# Patient Record
Sex: Male | Born: 1970
Health system: Southern US, Community
[De-identification: ages and names within clinical notes are randomized; demographics above are authoritative.]

## PROBLEM LIST (undated history)

## (undated) DIAGNOSIS — E785 Hyperlipidemia, unspecified: Secondary | ICD-10-CM

## (undated) DIAGNOSIS — I10 Essential (primary) hypertension: Secondary | ICD-10-CM

## (undated) DIAGNOSIS — M545 Low back pain, unspecified: Secondary | ICD-10-CM

## (undated) DIAGNOSIS — K219 Gastro-esophageal reflux disease without esophagitis: Secondary | ICD-10-CM

## (undated) DIAGNOSIS — K649 Unspecified hemorrhoids: Secondary | ICD-10-CM

## (undated) HISTORY — DX: Essential (primary) hypertension: I10

## (undated) HISTORY — PX: COLONOSCOPY: SHX174

## (undated) HISTORY — PX: BACK SURGERY: SHX140

## (undated) HISTORY — DX: Unspecified hemorrhoids: K64.9

## (undated) HISTORY — DX: Low back pain, unspecified: M54.50

## (undated) HISTORY — PX: OTHER SURGICAL HISTORY: SHX169

## (undated) HISTORY — PX: ELBOW SURGERY: SHX618

## (undated) HISTORY — DX: Low back pain: M54.5

## (undated) HISTORY — PX: SHOULDER SURGERY: SHX246

## (undated) HISTORY — DX: Hyperlipidemia, unspecified: E78.5

## (undated) SURGERY — Surgical Case
Anesthesia: *Unknown

---

## 2000-01-30 ENCOUNTER — Emergency Department (HOSPITAL_COMMUNITY): Admission: EM | Admit: 2000-01-30 | Discharge: 2000-01-30 | Payer: Self-pay | Admitting: Emergency Medicine

## 2006-05-15 ENCOUNTER — Ambulatory Visit: Payer: Self-pay | Admitting: Family Medicine

## 2006-05-15 LAB — CONVERTED CEMR LAB
Basophils Relative: 1 % (ref 0.0–1.0)
Calcium: 9.1 mg/dL (ref 8.4–10.5)
Chloride: 103 meq/L (ref 96–112)
Chol/HDL Ratio, serum: 6.4
Cholesterol: 239 mg/dL (ref 0–200)
Creatinine, Ser: 1 mg/dL (ref 0.4–1.5)
Eosinophil percent: 6.3 % — ABNORMAL HIGH (ref 0.0–5.0)
GFR calc non Af Amer: 90 mL/min
Glucose, Bld: 88 mg/dL (ref 70–99)
HCT: 46.2 % (ref 39.0–52.0)
HDL: 37.5 mg/dL — ABNORMAL LOW (ref >39.0)
Lymphocytes Relative: 33.9 % (ref 12.0–46.0)
MCHC: 35.1 g/dL (ref 30.0–36.0)
MCV: 88.4 fL (ref 78.0–100.0)
Monocytes Absolute: 0.8 10*3/uL — ABNORMAL HIGH (ref 0.2–0.7)
Neutro Abs: 3.3 10*3/uL (ref 1.4–7.7)
RBC: 5.22 M/uL (ref 4.22–5.81)
Sodium: 138 meq/L (ref 135–145)
TSH: 1.89 microintl units/mL (ref 0.35–5.50)
VLDL: 32 mg/dL (ref 0–40)

## 2006-06-16 ENCOUNTER — Ambulatory Visit: Payer: Self-pay | Admitting: Family Medicine

## 2006-08-19 ENCOUNTER — Ambulatory Visit: Payer: Self-pay | Admitting: Family Medicine

## 2006-08-19 LAB — CONVERTED CEMR LAB
Albumin: 3.6 g/dL (ref 3.5–5.2)
Alkaline Phosphatase: 64 units/L (ref 39–117)
Direct LDL: 136.5 mg/dL
HDL: 45.6 mg/dL (ref >39.0)
Total Bilirubin: 0.6 mg/dL (ref 0.3–1.2)
Total CHOL/HDL Ratio: 4.5
Triglycerides: 154 mg/dL — ABNORMAL HIGH (ref 0–149)
VLDL: 31 mg/dL (ref 0–40)

## 2006-10-20 ENCOUNTER — Ambulatory Visit: Payer: Self-pay | Admitting: Family Medicine

## 2006-11-05 ENCOUNTER — Ambulatory Visit: Payer: Self-pay | Admitting: Family Medicine

## 2006-11-05 LAB — CONVERTED CEMR LAB
Bilirubin, Direct: 0.1 mg/dL (ref 0.0–0.3)
Cholesterol: 179 mg/dL (ref 0–200)
HDL: 34.4 mg/dL — ABNORMAL LOW (ref >39.0)
LDL Cholesterol: 118 mg/dL — ABNORMAL HIGH (ref 0–99)
Total Bilirubin: 0.7 mg/dL (ref 0.3–1.2)
Total CHOL/HDL Ratio: 5.2
Total Protein: 7 g/dL (ref 6.0–8.3)
Triglycerides: 133 mg/dL (ref 0–149)

## 2007-01-22 ENCOUNTER — Ambulatory Visit: Payer: Self-pay | Admitting: Family Medicine

## 2007-01-22 DIAGNOSIS — K625 Hemorrhage of anus and rectum: Secondary | ICD-10-CM

## 2007-02-18 ENCOUNTER — Ambulatory Visit: Payer: Self-pay | Admitting: Gastroenterology

## 2007-02-22 DIAGNOSIS — M279 Disease of jaws, unspecified: Secondary | ICD-10-CM

## 2007-02-24 ENCOUNTER — Ambulatory Visit: Payer: Self-pay | Admitting: Gastroenterology

## 2007-02-24 ENCOUNTER — Ambulatory Visit: Payer: Self-pay | Admitting: Family Medicine

## 2007-02-24 DIAGNOSIS — E785 Hyperlipidemia, unspecified: Secondary | ICD-10-CM

## 2007-02-24 DIAGNOSIS — I10 Essential (primary) hypertension: Secondary | ICD-10-CM | POA: Insufficient documentation

## 2007-02-24 DIAGNOSIS — K648 Other hemorrhoids: Secondary | ICD-10-CM

## 2007-07-10 DIAGNOSIS — M545 Low back pain: Secondary | ICD-10-CM

## 2007-07-15 ENCOUNTER — Ambulatory Visit: Payer: Self-pay | Admitting: Family Medicine

## 2007-07-15 DIAGNOSIS — B369 Superficial mycosis, unspecified: Secondary | ICD-10-CM

## 2008-01-22 ENCOUNTER — Telehealth: Payer: Self-pay | Admitting: Family Medicine

## 2008-12-07 ENCOUNTER — Ambulatory Visit: Payer: Self-pay | Admitting: Family Medicine

## 2008-12-07 LAB — CONVERTED CEMR LAB
ALT: 40 units/L (ref 0–53)
Albumin: 4.7 g/dL (ref 3.5–5.2)
BUN: 16 mg/dL (ref 6–23)
Basophils Relative: 0.3 % (ref 0.0–3.0)
Bilirubin Urine: NEGATIVE
CO2: 32 meq/L (ref 19–32)
Chloride: 108 meq/L (ref 96–112)
Cholesterol: 166 mg/dL (ref 0–200)
Creatinine, Ser: 0.9 mg/dL (ref 0.4–1.5)
Eosinophils Absolute: 0.5 10*3/uL (ref 0.0–0.7)
Eosinophils Relative: 9.1 % — ABNORMAL HIGH (ref 0.0–5.0)
Glucose, Urine, Semiquant: NEGATIVE
HCT: 45.2 % (ref 39.0–52.0)
Ketones, urine, test strip: NEGATIVE
LDL Cholesterol: 105 mg/dL — ABNORMAL HIGH (ref 0–99)
Lymphs Abs: 2 10*3/uL (ref 0.7–4.0)
MCHC: 35 g/dL (ref 30.0–36.0)
MCV: 89.2 fL (ref 78.0–100.0)
Monocytes Absolute: 0.6 10*3/uL (ref 0.1–1.0)
Potassium: 4.3 meq/L (ref 3.5–5.1)
RBC: 5.07 M/uL (ref 4.22–5.81)
Specific Gravity, Urine: 1.015
TSH: 1.46 microintl units/mL (ref 0.35–5.50)
Total Protein: 7.6 g/dL (ref 6.0–8.3)
Triglycerides: 116 mg/dL (ref 0.0–149.0)
Urobilinogen, UA: 0.2
WBC: 5.8 10*3/uL (ref 4.5–10.5)

## 2008-12-12 ENCOUNTER — Ambulatory Visit: Payer: Self-pay | Admitting: Family Medicine

## 2010-02-20 ENCOUNTER — Telehealth: Payer: Self-pay | Admitting: Family Medicine

## 2010-04-25 ENCOUNTER — Telehealth: Payer: Self-pay | Admitting: Family Medicine

## 2010-06-12 NOTE — Progress Notes (Signed)
Summary: LISINOPRIL  Phone Note Refill Request Message from:  [phone] on February 20, 2010 9:50 AM  E-Scribe Request for lisinopril 20mg  take 1 by mouth at bedtime, med not on active med list, ok to refill?   Method Requested: Electronic Initial call taken by: Mervin Hack CMA Duncan Dull),  February 20, 2010 9:53 AM  Follow-up for Phone Call        please give the patient 30 days of medication.  No refills.  His last physical examination was August 2010 he needs to set up a complete physical examination sometime in the next two to 3 weeks.  At that point, then, we will give him refills x 1 year on all his medicines Follow-up by: Roderick Pee MD,  February 20, 2010 10:11 AM  Additional Follow-up for Phone Call Additional follow up Details #1::        Rx faxed to pharmacy, left detailed message on home phone answering machine that pt needs to schedule appt in the next 2-3 weeks. Additional Follow-up by: Mervin Hack CMA Duncan Dull),  February 20, 2010 12:32 PM    New/Updated Medications: LISINOPRIL 20 MG TABS (LISINOPRIL) take 1 by mouth once daily at bedtime Prescriptions: LISINOPRIL 20 MG TABS (LISINOPRIL) take 1 by mouth once daily at bedtime  #30 x 0   Entered by:   Mervin Hack CMA (AAMA)   Authorized by:   Roderick Pee MD   Signed by:   Mervin Hack CMA (AAMA) on 02/20/2010   Method used:   [facility]        [facility]  [place] [address] [facility]       [address]       [place], [place]  [address]       Ph: [phone] or [phone]       Fax: [phone]   RxID:   [id number]

## 2010-06-14 NOTE — Progress Notes (Signed)
Summary: REFILL REQUEST (Pt has cpx appt on 07/03/10 at 10:30am)  Phone Note Refill Request   Refills Requested: Medication #1:  LISINOPRIL 20 MG TABS take 1 by mouth once daily at bedtime.   Notes[facility]... Pt has cpx appt scheduled for Feb. 21, 2012 at 10:30am.  Pt requests that enough of med to do him till his cpx appt be sent to [facility]... Pt has cpx appt scheduled for Feb. 21, 2012 at 10:30am.   Initial call taken by: Debbra Riding,  April 25, 2010 2:28 PM    Prescriptions: ZOCOR 40 MG  TABS (SIMVASTATIN) 1 at bedtime  #30 Tablet x 0   Entered by:   Kern Reap CMA (AAMA)   Authorized by:   Roderick Pee MD   Signed by:   Kern Reap CMA (AAMA) on 04/26/2010   Method used:   [facility]        [facility]  [place] [address] [facility]       [address]       [place], [place]  [address]       Ph: [phone] or [phone]       Fax: [phone]   RxID:   [id number] LISINOPRIL 20 MG TABS (LISINOPRIL) take 1 by mouth once daily at bedtime  #30 x 0   Entered by:   Kern Reap CMA (AAMA)   Authorized by:   Roderick Pee MD   Signed by:   Kern Reap CMA (AAMA) on 04/26/2010   Method used:   [facility]        [facility]  [place] [address] [facility]       [address]       [place], [place]  [address]       Ph: [phone] or [phone]       Fax: [phone]   RxID:   [id number]

## 2010-07-02 ENCOUNTER — Encounter: Payer: Self-pay | Admitting: Family Medicine

## 2010-07-03 ENCOUNTER — Ambulatory Visit (INDEPENDENT_AMBULATORY_CARE_PROVIDER_SITE_OTHER): Payer: BC Managed Care – PPO | Admitting: Family Medicine

## 2010-07-03 ENCOUNTER — Encounter: Payer: Self-pay | Admitting: Family Medicine

## 2010-07-03 DIAGNOSIS — I10 Essential (primary) hypertension: Secondary | ICD-10-CM

## 2010-07-03 DIAGNOSIS — G8929 Other chronic pain: Secondary | ICD-10-CM

## 2010-07-03 DIAGNOSIS — E785 Hyperlipidemia, unspecified: Secondary | ICD-10-CM

## 2010-07-03 DIAGNOSIS — M542 Cervicalgia: Secondary | ICD-10-CM

## 2010-07-03 LAB — CBC WITH DIFFERENTIAL/PLATELET
Basophils Absolute: 0.1 10*3/uL (ref 0.0–0.1)
Basophils Relative: 0.8 % (ref 0.0–3.0)
Eosinophils Relative: 6.8 % — ABNORMAL HIGH (ref 0.0–5.0)
Hemoglobin: 16.4 g/dL (ref 13.0–17.0)
Lymphocytes Relative: 31.5 % (ref 12.0–46.0)
Monocytes Relative: 9.3 % (ref 3.0–12.0)
Neutro Abs: 3.4 10*3/uL (ref 1.4–7.7)
RBC: 5.3 Mil/uL (ref 4.22–5.81)
RDW: 12.8 % (ref 11.5–14.6)
WBC: 6.6 10*3/uL (ref 4.5–10.5)

## 2010-07-03 LAB — HEPATIC FUNCTION PANEL
ALT: 56 U/L — ABNORMAL HIGH (ref 0–53)
AST: 32 U/L (ref 0–37)
Alkaline Phosphatase: 60 U/L (ref 39–117)
Total Bilirubin: 0.9 mg/dL (ref 0.3–1.2)

## 2010-07-03 LAB — BASIC METABOLIC PANEL
Calcium: 9.6 mg/dL (ref 8.4–10.5)
GFR: 96.69 mL/min (ref >60.00)
Potassium: 4.6 mEq/L (ref 3.5–5.1)
Sodium: 143 mEq/L (ref 135–145)

## 2010-07-03 LAB — LIPID PANEL
Total CHOL/HDL Ratio: 6
VLDL: 35.8 mg/dL (ref 0.0–40.0)

## 2010-07-03 LAB — POCT URINALYSIS DIPSTICK
Bilirubin, UA: NEGATIVE
Glucose, UA: NEGATIVE
Leukocytes, UA: NEGATIVE
Nitrite, UA: NEGATIVE
Urobilinogen, UA: 0.2

## 2010-07-03 LAB — TSH: TSH: 1.36 u[IU]/mL (ref 0.35–5.50)

## 2010-07-03 MED ORDER — SIMVASTATIN 40 MG PO TABS: 40.0000 mg | ORAL_TABLET | Freq: Every day | ORAL | Status: DC

## 2010-07-03 MED ORDER — LISINOPRIL 20 MG PO TABS: 20.0000 mg | ORAL_TABLET | Freq: Every day | ORAL | Status: DC

## 2010-07-03 NOTE — Progress Notes (Signed)
  Subjective:    Patient ID: Bruce Terry, male    DOB: 07-19-1970, 40 y.o.   MRN: [id number]  HPI Bruce Terry is a 40 year old male, married, nonsmoker comes in today for evaluation of hyperlipidemia, hypertension, and a new problem of neck pain.  His hyperlipidemia is managed with Zocor 40 mg nightly.  He was on lisinopril 20 mg daily for hypertension, but stopped it.  He stated caused some side effects, made him feel odd in the morning.  For the past 6, months.  He's had constant neck pain.  He points to the C4-C5 area in the midline of his neck as a source of his pain.  He describes as a dull ache initially it was sharp.  It is constant.  A5 on a scale of one to 10.  It does not radiate.  No history of trauma.  Neurologic and musculoskeletal review of systems negative   Review of Systems  Constitutional: Negative.   HENT: Negative.   Eyes: Negative.   Respiratory: Negative.   Cardiovascular: Negative.   Gastrointestinal: Negative.   Genitourinary: Negative.   Musculoskeletal: Negative.   Skin: Negative.   Neurological: Negative.   Hematological: Negative.   Psychiatric/Behavioral: Negative.        Objective:   Physical Exam  Constitutional: He is oriented to person, place, and time. He appears well-developed and well-nourished.  HENT:  Head: Normocephalic and atraumatic.  Right Ear: External ear normal.  Left Ear: External ear normal.  Nose: Nose normal.  Mouth/Throat: Oropharynx is clear and moist.  Eyes: Conjunctivae and EOM are normal. Pupils are equal, round, and reactive to light.  Neck: Normal range of motion. Neck supple. No JVD present. No tracheal deviation present. No thyromegaly present.  Cardiovascular: Normal rate, regular rhythm, normal heart sounds and intact distal pulses.  Exam reveals no gallop and no friction rub.   No murmur heard. Pulmonary/Chest: Effort normal and breath sounds normal. No stridor. No respiratory distress. He has no wheezes. He has no  rales. He exhibits no tenderness.  Abdominal: Soft. Bowel sounds are normal. He exhibits no distension and no mass. There is no tenderness. There is no rebound and no guarding.  Genitourinary: Rectum normal, prostate normal and penis normal. Guaiac negative stool. No penile tenderness.  Musculoskeletal: Normal range of motion. He exhibits no edema and no tenderness.  Lymphadenopathy:    He has no cervical adenopathy.  Neurological: He is alert and oriented to person, place, and time. He has normal reflexes. No cranial nerve deficit. He exhibits normal muscle tone.  Skin: Skin is warm and dry. No rash noted. No erythema. No pallor.  Psychiatric: He has a normal mood and affect. His behavior is normal. Judgment and thought content normal.          Assessment & Plan:  Hyperlipidemia,,,,,,,,,,,, continue simvastatin 40 mg daily check lipid panel today.   hypertension,,,,,,,,, restart lisinopril 10 mg daily BP checked daily.  Return in 4 weeks for follow-up.  Neck pain,,,,,,,,,,, Motrin 400 mg b.i.d. PT consult ASAP

## 2010-07-03 NOTE — Patient Instructions (Signed)
Continued the simvastatin nightly at bedtime, and add an aspirin tablet.  Restart the lisinopril to 10 mg daily in the morning.  Check her blood pressure daily in the morning.  Follow-up in 4 weeks.  When he returned ring.  A record of all your blood pressure readings and that device

## 2010-07-04 LAB — LDL CHOLESTEROL, DIRECT: Direct LDL: 168.5 mg/dL

## 2010-07-10 ENCOUNTER — Ambulatory Visit: Payer: BC Managed Care – PPO

## 2010-07-25 ENCOUNTER — Emergency Department (HOSPITAL_COMMUNITY): Payer: BC Managed Care – PPO

## 2010-07-25 ENCOUNTER — Emergency Department (HOSPITAL_COMMUNITY)
Admission: EM | Admit: 2010-07-25 | Discharge: 2010-07-25 | Disposition: A | Discharge status: Home / Self Care | Payer: BC Managed Care – PPO | Attending: Emergency Medicine | Admitting: Emergency Medicine

## 2010-07-25 DIAGNOSIS — M546 Pain in thoracic spine: Secondary | ICD-10-CM | POA: Insufficient documentation

## 2010-07-25 DIAGNOSIS — K7689 Other specified diseases of liver: Secondary | ICD-10-CM | POA: Insufficient documentation

## 2010-07-25 DIAGNOSIS — IMO0002 Reserved for concepts with insufficient information to code with codable children: Secondary | ICD-10-CM | POA: Insufficient documentation

## 2010-07-25 DIAGNOSIS — M545 Low back pain, unspecified: Secondary | ICD-10-CM | POA: Insufficient documentation

## 2010-07-25 DIAGNOSIS — M542 Cervicalgia: Secondary | ICD-10-CM | POA: Insufficient documentation

## 2010-07-25 DIAGNOSIS — M79609 Pain in unspecified limb: Secondary | ICD-10-CM | POA: Insufficient documentation

## 2010-07-25 DIAGNOSIS — R079 Chest pain, unspecified: Secondary | ICD-10-CM | POA: Insufficient documentation

## 2010-07-25 LAB — CBC
MCH: 30.4 pg (ref 26.0–34.0)
Platelets: 231 10*3/uL (ref 150–400)
RBC: 5.13 MIL/uL (ref 4.22–5.81)
WBC: 6.7 10*3/uL (ref 4.0–10.5)

## 2010-07-25 LAB — LACTIC ACID, PLASMA: Lactic Acid, Venous: 2.1 mmol/L (ref 0.5–2.2)

## 2010-07-25 LAB — COMPREHENSIVE METABOLIC PANEL
ALT: 53 U/L (ref 0–53)
Calcium: 9 mg/dL (ref 8.4–10.5)
Glucose, Bld: 159 mg/dL — ABNORMAL HIGH (ref 70–99)
Sodium: 137 mEq/L (ref 135–145)
Total Protein: 7.6 g/dL (ref 6.0–8.3)

## 2010-07-25 LAB — POCT I-STAT, CHEM 8
Hemoglobin: 15.6 g/dL (ref 13.0–17.0)
Sodium: 141 mEq/L (ref 135–145)
TCO2: 25 mmol/L (ref 0–100)

## 2010-07-25 LAB — PROTIME-INR: Prothrombin Time: 12.9 seconds (ref 11.6–15.2)

## 2010-07-25 MED ORDER — IOHEXOL 300 MG/ML  SOLN: 100.0000 mL | Freq: Once | INTRAMUSCULAR | Status: DC | PRN

## 2010-07-30 ENCOUNTER — Ambulatory Visit (INDEPENDENT_AMBULATORY_CARE_PROVIDER_SITE_OTHER): Payer: BC Managed Care – PPO | Admitting: Family Medicine

## 2010-07-30 ENCOUNTER — Encounter: Payer: Self-pay | Admitting: Family Medicine

## 2010-07-30 VITALS — BP 148/108 | Temp 98.3°F | Wt 264.0 lb

## 2010-07-30 DIAGNOSIS — M545 Low back pain: Secondary | ICD-10-CM

## 2010-07-30 MED ORDER — OXYCODONE HCL 10 MG PO TB12: ORAL_TABLET | ORAL | Status: DC

## 2010-07-30 NOTE — Patient Instructions (Signed)
Called Dr. Hoy Register orthopedic office in [phone] and given an appointment to see one of the doctors ASAP.......Marland Kitchen Dr. Cleophas Dunker, Dr. Valentina Gu
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
## 2010-07-31 ENCOUNTER — Ambulatory Visit: Payer: BC Managed Care – PPO | Admitting: Family Medicine

## 2010-07-31 ENCOUNTER — Other Ambulatory Visit: Payer: Self-pay | Admitting: Orthopedic Surgery

## 2010-07-31 ENCOUNTER — Ambulatory Visit
Admission: RE | Admit: 2010-07-31 | Discharge: 2010-07-31 | Disposition: A | Discharge status: Home / Self Care | Payer: BC Managed Care – PPO | Source: Ambulatory Visit | Attending: Orthopedic Surgery | Admitting: Orthopedic Surgery

## 2010-07-31 DIAGNOSIS — M549 Dorsalgia, unspecified: Secondary | ICD-10-CM

## 2010-07-31 DIAGNOSIS — R200 Anesthesia of skin: Secondary | ICD-10-CM

## 2010-09-25 NOTE — Assessment & Plan Note (Signed)
[facility]                         GASTROENTEROLOGY OFFICE NOTE   NAME:Buskey, ROXAS CLYMER                      MRN:          [id number]  DATE:02/18/2007                            DOB:          11-18-1970    REFERRING PHYSICIAN:  Tinnie Gens A. Tawanna Cooler, M.D.   REASON FOR CONSULTATION:  Hematochezia.   HISTORY OF PRESENT ILLNESS:  This is a 40 year old white male with a  several-year history of intermittent small volume hematochezia.  He  relates a substantial increase in rectal bleeding over the past two  months with much larger amounts of bright red blood in the commode  following bowel movements.  He notes occasional rectal itching and  discomfort.  He had an episode of diarrhea about 2-3 weeks ago that  lasted about 7 days that he attributed to virus.  He notes no change in  stool caliber, abdominal pain, or weight loss.  His mother has a history  of colon polyps.  There is no family history of colon cancer or  inflammatory bowel disease.   PAST MEDICAL HISTORY:  Hypertension  Hyperlipidemia  Two bulging lumbar disks.   CURRENT MEDICATIONS:  1. Lipitor 40 mg p.o. daily.  2. Lisinopril 20 mg p.o. daily.   ALLERGIES:  No known drug allergies.   SOCIAL HISTORY:  As per the handwritten form.   REVIEW OF SYSTEMS:  Entirely negative except for the gastrointestinal  review of systems as in the history of present illness.   PHYSICAL EXAMINATION:  GENERAL:  Obese white male in no acute distress.  VITAL SIGNS:  Height 5 feet 11 inches, weight 257.4 pounds, blood  pressure 142/84, pulse 80 and regular.  HEENT:  Anicteric sclerae.  Oropharynx clear.  CHEST:  Clear to auscultation bilaterally.  HEART:  Regular rate and rhythm without murmurs appreciated.  ABDOMEN:  Soft, nontender, and nondistended.  Normal active bowel  sounds.  No palpable organomegaly, masses, or hernias.  RECTAL:  Deferred to time of colonoscopy.  Recent examination by Tinnie Gens  A.  Tawanna Cooler, M.D. revealed no internal or external lesions and brown  hemoccult positive stool.  NEUROLOGY:  Alert and oriented x3.  Grossly nonfocal.   ASSESSMENT:  Hematochezia, hemoccult positive stool, rectal discomfort,  and rectal itching.  I suspect this is hemorrhoids, however proctitis,  colorectal neoplasms, and other disorders need to be further excluded.  Risks, benefits, and alternatives to colonoscopy with possible biopsy  and polypectomy discussed with the patient and he consents to proceed.  This will be scheduled electively.     Venita Lick. Russella Dar, MD, [facility]  Electronically Signed    MTS/MedQ  DD: 02/18/2007  DT: 02/18/2007  Job #: [id number]   cc:   Tinnie Gens A. Tawanna Cooler, MD

## 2010-11-30 ENCOUNTER — Other Ambulatory Visit: Payer: Self-pay | Admitting: Neurosurgery

## 2010-11-30 DIAGNOSIS — M5416 Radiculopathy, lumbar region: Secondary | ICD-10-CM

## 2010-11-30 DIAGNOSIS — M545 Low back pain: Secondary | ICD-10-CM

## 2010-12-03 ENCOUNTER — Ambulatory Visit
Admission: RE | Admit: 2010-12-03 | Discharge: 2010-12-03 | Disposition: A | Discharge status: Home / Self Care | Payer: BC Managed Care – PPO | Source: Ambulatory Visit | Attending: Neurosurgery | Admitting: Neurosurgery

## 2010-12-03 DIAGNOSIS — M5416 Radiculopathy, lumbar region: Secondary | ICD-10-CM

## 2010-12-03 DIAGNOSIS — M545 Low back pain: Secondary | ICD-10-CM

## 2010-12-03 MED ORDER — DEXTROSE-NACL 5-0.45 % IV SOLN: INTRAVENOUS | Status: DC

## 2010-12-03 MED ORDER — DIAZEPAM 2 MG PO TABS
10.0000 mg | ORAL_TABLET | Freq: Once | ORAL | Status: AC
  Administered 2010-12-03: 10 mg via ORAL

## 2010-12-03 MED ORDER — MEPERIDINE HCL 100 MG/ML IJ SOLN
75.0000 mg | Freq: Once | INTRAMUSCULAR | Status: AC
  Administered 2010-12-03: 75 mg via INTRAMUSCULAR

## 2010-12-03 MED ORDER — ONDANSETRON HCL 4 MG/2ML IJ SOLN
4.0000 mg | Freq: Once | INTRAMUSCULAR | Status: AC
  Administered 2010-12-03: 4 mg via INTRAMUSCULAR

## 2010-12-03 MED ORDER — IOHEXOL 180 MG/ML  SOLN
15.0000 mL | Freq: Once | INTRAMUSCULAR | Status: AC | PRN
  Administered 2010-12-03: 15 mL via INTRAVENOUS

## 2011-06-14 ENCOUNTER — Other Ambulatory Visit: Payer: Self-pay | Admitting: *Deleted

## 2011-06-14 DIAGNOSIS — I10 Essential (primary) hypertension: Secondary | ICD-10-CM

## 2011-06-14 MED ORDER — LISINOPRIL 20 MG PO TABS: 20.0000 mg | ORAL_TABLET | Freq: Every day | ORAL | Status: DC

## 2011-07-10 ENCOUNTER — Encounter: Payer: Self-pay | Admitting: Family Medicine

## 2011-07-10 ENCOUNTER — Ambulatory Visit (INDEPENDENT_AMBULATORY_CARE_PROVIDER_SITE_OTHER): Payer: BC Managed Care – PPO | Admitting: Family Medicine

## 2011-07-10 DIAGNOSIS — I10 Essential (primary) hypertension: Secondary | ICD-10-CM

## 2011-07-10 DIAGNOSIS — E785 Hyperlipidemia, unspecified: Secondary | ICD-10-CM

## 2011-07-10 DIAGNOSIS — R5383 Other fatigue: Secondary | ICD-10-CM | POA: Insufficient documentation

## 2011-07-10 DIAGNOSIS — R5381 Other malaise: Secondary | ICD-10-CM

## 2011-07-10 LAB — POCT URINALYSIS DIPSTICK
Bilirubin, UA: NEGATIVE
Glucose, UA: NEGATIVE
Leukocytes, UA: NEGATIVE
Nitrite, UA: NEGATIVE
pH, UA: 5.5

## 2011-07-10 LAB — CBC WITH DIFFERENTIAL/PLATELET
Basophils Absolute: 0 10*3/uL (ref 0.0–0.1)
Eosinophils Relative: 2 % (ref 0.0–5.0)
Lymphocytes Relative: 27.3 % (ref 12.0–46.0)
Monocytes Relative: 9.5 % (ref 3.0–12.0)
Neutrophils Relative %: 60.6 % (ref 43.0–77.0)
Platelets: 269 10*3/uL (ref 150.0–400.0)
RDW: 12.4 % (ref 11.5–14.6)
WBC: 8.8 10*3/uL (ref 4.5–10.5)

## 2011-07-10 LAB — BASIC METABOLIC PANEL
BUN: 19 mg/dL (ref 6–23)
GFR: 106.85 mL/min (ref >60.00)
Potassium: 5.1 mEq/L (ref 3.5–5.1)
Sodium: 138 mEq/L (ref 135–145)

## 2011-07-10 LAB — LIPID PANEL
Cholesterol: 241 mg/dL — ABNORMAL HIGH (ref 0–200)
Triglycerides: 127 mg/dL (ref 0.0–149.0)
VLDL: 25.4 mg/dL (ref 0.0–40.0)

## 2011-07-10 LAB — HEPATIC FUNCTION PANEL
AST: 37 U/L (ref 0–37)
Albumin: 5.1 g/dL (ref 3.5–5.2)
Alkaline Phosphatase: 57 U/L (ref 39–117)
Total Protein: 8.2 g/dL (ref 6.0–8.3)

## 2011-07-10 LAB — LDL CHOLESTEROL, DIRECT: Direct LDL: 191.7 mg/dL

## 2011-07-10 LAB — TSH: TSH: 1.96 u[IU]/mL (ref 0.35–5.50)

## 2011-07-10 MED ORDER — SIMVASTATIN 40 MG PO TABS: 40.0000 mg | ORAL_TABLET | Freq: Every day | ORAL | Status: DC

## 2011-07-10 MED ORDER — LISINOPRIL 20 MG PO TABS: 20.0000 mg | ORAL_TABLET | Freq: Every day | ORAL | Status: DC

## 2011-07-10 NOTE — Patient Instructions (Signed)
Continue your current medications  Purchasing new digital blood pressure cuff  We will cardiomegaly get your lab work back

## 2011-07-10 NOTE — Progress Notes (Signed)
  Subjective:    Patient ID: Bruce Terry, male    DOB: 15-Dec-1970, 41 y.o.   MRN: [id number]  HPI Bruce Terry is a 41 year old married male nonsmoker who comes in today for evaluation of 3 problems  He has a history of hypertension his blood pressure cuff at home shows he has elevated blood pressures 140-150 85-90. Blood pressure here 120/80. Advised to get a new cuff  He takes Zocor 40 mg daily for hyperlipidemia he still for a followup lipid panel  Last March she fell and injured his right hand. He's undergone numerous operations. He seen by Dr. Aldona Bar called the hand surgeon  He's also had epidural steroid injections because of back pain and bulging discs. He's seeing the neurosurgeon about that. The neurosurgeon gives him Percocet 5 mg when necessary for pain however he states he one month of medication last in 3 months he does not take on a daily basis he only takes it when he is miserable  He's noticed the morning he feels tired fatigued and no energy   Review of Systems  Constitutional: Positive for fatigue.  HENT: Negative.   Eyes: Negative.   Respiratory: Negative.   Cardiovascular: Negative.   Gastrointestinal: Negative.   Genitourinary: Negative.   Musculoskeletal: Negative.   Skin: Negative.   Neurological: Negative.   Hematological: Negative.   Psychiatric/Behavioral: Negative.        Objective:   Physical Exam  Constitutional: He is oriented to person, place, and time. He appears well-developed and well-nourished.  HENT:  Head: Normocephalic and atraumatic.  Right Ear: External ear normal.  Left Ear: External ear normal.  Nose: Nose normal.  Mouth/Throat: Oropharynx is clear and moist.  Eyes: Conjunctivae and EOM are normal. Pupils are equal, round, and reactive to light.  Neck: Normal range of motion. Neck supple. No JVD present. No tracheal deviation present. No thyromegaly present.  Pulmonary/Chest: No stridor.  Musculoskeletal: Normal range of motion. He  exhibits no edema and no tenderness.  Lymphadenopathy:    He has no cervical adenopathy.  Neurological: He is alert and oriented to person, place, and time. He has normal reflexes. No cranial nerve deficit. He exhibits normal muscle tone.  Skin: Skin is warm and dry.  Psychiatric: He has a normal mood and affect. His behavior is normal. Judgment and thought content normal.          Assessment & Plan:  Hypertension continue current medication  Hyperlipidemia check labs  Fatigue check labs  Ongoing pain right arm secondary to injury at work continue followup by hand surgeon  History of degenerative disease followup by neurosurgery

## 2011-07-22 ENCOUNTER — Encounter: Payer: Self-pay | Admitting: Family Medicine

## 2011-07-22 ENCOUNTER — Ambulatory Visit (INDEPENDENT_AMBULATORY_CARE_PROVIDER_SITE_OTHER): Payer: BC Managed Care – PPO | Admitting: Family Medicine

## 2011-07-22 VITALS — BP 110/80 | Temp 98.3°F | Wt 266.0 lb

## 2011-07-22 DIAGNOSIS — E785 Hyperlipidemia, unspecified: Secondary | ICD-10-CM

## 2011-07-22 MED ORDER — ATORVASTATIN CALCIUM 40 MG PO TABS: 40.0000 mg | ORAL_TABLET | Freq: Every day | ORAL | Status: DC

## 2011-07-22 NOTE — Patient Instructions (Signed)
Stop the Zocor  Begin Lipitor 1 tablet a day at bedtime also take it with a baby aspirin and take your lisinopril at bedtime ,,,,,,,,,,,,,,,,,,, therefore you only have to take all your medications once a day

## 2011-07-22 NOTE — Progress Notes (Signed)
  Subjective:    Patient ID: Bruce Terry, male    DOB: 02/13/1971, 41 y.o.   MRN: [id number]  HPI Buell is a 41 year old married male nonsmoker who comes in today for evaluation of hypertension and hyperlipidemia  He takes lisinopril 20 mg daily BP is 110/80  He's on Zocor 40 mg each bedtime however his total is 241 triglycerides 127 HDL 52 but his LDL is 191. He states he's been compliant in taking the medication on a daily basis.   Review of Systems    general and metabolic review of systems otherwise negative Objective:   Physical Exam  Well-developed well-nourished male in no acute distress      Assessment & Plan:  Hyperlipidemia not at goal DC Zocor start Lipitor followup in 2 months  Hypertension at goal continue lisinopril 20 mg daily

## 2011-09-19 ENCOUNTER — Other Ambulatory Visit (INDEPENDENT_AMBULATORY_CARE_PROVIDER_SITE_OTHER): Payer: BC Managed Care – PPO

## 2011-09-19 DIAGNOSIS — E785 Hyperlipidemia, unspecified: Secondary | ICD-10-CM

## 2011-09-19 LAB — LIPID PANEL
Cholesterol: 138 mg/dL (ref 0–200)
HDL: 45.8 mg/dL (ref >39.00)

## 2011-09-19 LAB — HEPATIC FUNCTION PANEL
ALT: 61 U/L — ABNORMAL HIGH (ref 0–53)
AST: 46 U/L — ABNORMAL HIGH (ref 0–37)
Total Bilirubin: 0.8 mg/dL (ref 0.3–1.2)

## 2011-09-26 ENCOUNTER — Encounter: Payer: Self-pay | Admitting: Family Medicine

## 2011-09-26 ENCOUNTER — Ambulatory Visit (INDEPENDENT_AMBULATORY_CARE_PROVIDER_SITE_OTHER): Payer: BC Managed Care – PPO | Admitting: Family Medicine

## 2011-09-26 VITALS — BP 120/80 | Temp 98.1°F | Wt 271.0 lb

## 2011-09-26 DIAGNOSIS — E785 Hyperlipidemia, unspecified: Secondary | ICD-10-CM

## 2011-09-26 DIAGNOSIS — I10 Essential (primary) hypertension: Secondary | ICD-10-CM

## 2011-09-26 NOTE — Progress Notes (Signed)
  Subjective:    Patient ID: Bruce Terry, male    DOB: [birth year], 41 y.o.   MRN: [id number]  HPI Bruce Terry is a 41 year old male who comes in today for followup of hyperlipidemia  We started him on Lipitor 40 mg daily 2 months ago total cholesterol is dropped from 241 down to 138. LDL down to 70. No side effects from medications over function studies normal he also takes an aspirin tablet daily.  He also takes lisinopril 20 mg daily for hypertension BP 120/80   Review of Systems    general and metabolic review of systems otherwise negative physical examination February 2013 Objective:   Physical Exam  Well-developed well-nourished male in no acute distress      Assessment & Plan:  Hyperlipidemia goal continue current therapy  Hypertension continue current therapy  Followup in February 2013 for annual exam

## 2011-09-26 NOTE — Patient Instructions (Signed)
Continue current medications  Followup in February 2014 three-ring your exam sooner if any problems  Nonfasting labs one week prior

## 2011-12-05 ENCOUNTER — Telehealth: Payer: Self-pay

## 2011-12-05 MED ORDER — ATORVASTATIN CALCIUM 80 MG PO TABS: 40.0000 mg | ORAL_TABLET | Freq: Every day | ORAL | Status: DC

## 2011-12-05 NOTE — Telephone Encounter (Signed)
VM from don at [facility] - pt request lipitor 80mg  1/2 tab qd instead of 40mg  qd - it will be cheaper and he can use local pharmacy instead of mailorder Called and gave order for 80mg  1/2 tab qd change to med list done

## 2011-12-10 ENCOUNTER — Other Ambulatory Visit: Payer: Self-pay | Admitting: *Deleted

## 2011-12-10 MED ORDER — ATORVASTATIN CALCIUM 80 MG PO TABS: 80.0000 mg | ORAL_TABLET | Freq: Every day | ORAL | Status: DC

## 2011-12-10 NOTE — Telephone Encounter (Signed)
Cheaper through local pharmacy
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
## 2012-02-17 ENCOUNTER — Encounter: Payer: Self-pay | Admitting: Family Medicine

## 2012-02-17 ENCOUNTER — Ambulatory Visit (INDEPENDENT_AMBULATORY_CARE_PROVIDER_SITE_OTHER): Payer: BC Managed Care – PPO | Admitting: Family Medicine

## 2012-02-17 ENCOUNTER — Ambulatory Visit (INDEPENDENT_AMBULATORY_CARE_PROVIDER_SITE_OTHER)
Admission: RE | Admit: 2012-02-17 | Discharge: 2012-02-17 | Disposition: A | Discharge status: Home / Self Care | Payer: BC Managed Care – PPO | Source: Ambulatory Visit | Attending: Family Medicine | Admitting: Family Medicine

## 2012-02-17 VITALS — BP 130/80 | HR 85 | Temp 98.2°F | Resp 18

## 2012-02-17 DIAGNOSIS — M25579 Pain in unspecified ankle and joints of unspecified foot: Secondary | ICD-10-CM

## 2012-02-17 DIAGNOSIS — M25572 Pain in left ankle and joints of left foot: Secondary | ICD-10-CM

## 2012-02-17 NOTE — Patient Instructions (Signed)
Go to the main office now for your x-ray. Then go home and I will call you the report

## 2012-02-17 NOTE — Progress Notes (Signed)
  Subjective:    Patient ID: Bruce Terry, male    DOB: [birth year], 41 y.o.   MRN: [id number]  HPI Bruce Terry is a 41 year old married male nonsmoker who comes in today for evaluation of pain in his left ankle  He states on Saturday he and his son Rayna Sexton walking through the woods he stepped up onto a fall injury and slipped. He immediately noticed pain in the medial portion of his left ankle. He went home he's had it elevated and iced since then but is still having severe pain.   Review of Systems    general and orthopedic review of systems otherwise negative Objective:   Physical Exam  Well-developed well-nourished male in no acute distress examination of the left ankle shows the peripheral pulses normal there is bleeding under the skin medial ankle. Tenderness to range of motion      Assessment & Plan:  Pain left ankle probably torn a ligament x-ray rule out fibular fracture

## 2012-03-18 ENCOUNTER — Other Ambulatory Visit: Payer: Self-pay | Admitting: Anesthesiology

## 2012-03-20 ENCOUNTER — Other Ambulatory Visit: Payer: Self-pay | Admitting: Anesthesiology

## 2012-03-20 DIAGNOSIS — M549 Dorsalgia, unspecified: Secondary | ICD-10-CM

## 2012-03-30 ENCOUNTER — Ambulatory Visit
Admission: RE | Admit: 2012-03-30 | Discharge: 2012-03-30 | Disposition: A | Discharge status: Home / Self Care | Payer: BC Managed Care – PPO | Source: Ambulatory Visit | Attending: Anesthesiology | Admitting: Anesthesiology

## 2012-03-30 VITALS — BP 134/59 | HR 75

## 2012-03-30 DIAGNOSIS — M549 Dorsalgia, unspecified: Secondary | ICD-10-CM

## 2012-03-30 DIAGNOSIS — M545 Low back pain: Secondary | ICD-10-CM

## 2012-03-30 MED ORDER — IOHEXOL 180 MG/ML  SOLN
15.0000 mL | Freq: Once | INTRAMUSCULAR | Status: AC | PRN
  Administered 2012-03-30: 15 mL via INTRATHECAL

## 2012-03-30 MED ORDER — DIAZEPAM 5 MG PO TABS
10.0000 mg | ORAL_TABLET | Freq: Once | ORAL | Status: AC
  Administered 2012-03-30: 10 mg via ORAL

## 2012-03-30 MED ORDER — ONDANSETRON HCL 4 MG/2ML IJ SOLN
4.0000 mg | Freq: Once | INTRAMUSCULAR | Status: AC
  Administered 2012-03-30: 4 mg via INTRAMUSCULAR

## 2012-03-30 MED ORDER — HYDROMORPHONE HCL PF 2 MG/ML IJ SOLN
2.0000 mg | Freq: Once | INTRAMUSCULAR | Status: AC
  Administered 2012-03-30: 2 mg via INTRAMUSCULAR

## 2012-07-02 ENCOUNTER — Other Ambulatory Visit (INDEPENDENT_AMBULATORY_CARE_PROVIDER_SITE_OTHER): Payer: BC Managed Care – PPO

## 2012-07-02 ENCOUNTER — Telehealth: Payer: Self-pay | Admitting: Family Medicine

## 2012-07-02 DIAGNOSIS — I10 Essential (primary) hypertension: Secondary | ICD-10-CM

## 2012-07-02 DIAGNOSIS — E785 Hyperlipidemia, unspecified: Secondary | ICD-10-CM

## 2012-07-02 LAB — CBC WITH DIFFERENTIAL/PLATELET
Basophils Relative: 0.6 % (ref 0.0–3.0)
Eosinophils Absolute: 0.2 10*3/uL (ref 0.0–0.7)
Hemoglobin: 15.5 g/dL (ref 13.0–17.0)
MCHC: 34.2 g/dL (ref 30.0–36.0)
MCV: 89.3 fl (ref 78.0–100.0)
Monocytes Absolute: 0.6 10*3/uL (ref 0.1–1.0)
Neutro Abs: 2.8 10*3/uL (ref 1.4–7.7)
RBC: 5.07 Mil/uL (ref 4.22–5.81)

## 2012-07-02 LAB — LIPID PANEL
Cholesterol: 161 mg/dL (ref 0–200)
LDL Cholesterol: 109 mg/dL — ABNORMAL HIGH (ref 0–99)
Total CHOL/HDL Ratio: 4

## 2012-07-02 LAB — POCT URINALYSIS DIPSTICK
Blood, UA: NEGATIVE
Protein, UA: NEGATIVE
Spec Grav, UA: 1.02
Urobilinogen, UA: 0.2

## 2012-07-02 LAB — HEPATIC FUNCTION PANEL
Alkaline Phosphatase: 62 U/L (ref 39–117)
Bilirubin, Direct: 0.1 mg/dL (ref 0.0–0.3)
Total Protein: 7.3 g/dL (ref 6.0–8.3)

## 2012-07-02 LAB — BASIC METABOLIC PANEL
CO2: 28 mEq/L (ref 19–32)
Calcium: 9.4 mg/dL (ref 8.4–10.5)
Creatinine, Ser: 0.8 mg/dL (ref 0.4–1.5)
Glucose, Bld: 104 mg/dL — ABNORMAL HIGH (ref 70–99)

## 2012-07-02 MED ORDER — LISINOPRIL 20 MG PO TABS: 20.0000 mg | ORAL_TABLET | Freq: Every day | ORAL | Status: DC

## 2012-07-02 MED ORDER — ATORVASTATIN CALCIUM 80 MG PO TABS: 80.0000 mg | ORAL_TABLET | Freq: Every day | ORAL | Status: DC

## 2012-07-02 NOTE — Telephone Encounter (Signed)
Rx sent 

## 2012-07-02 NOTE — Telephone Encounter (Signed)
Patient came in for lab work today, he says he will run out of medication before his visit next week. Could you please call in his Cholosterol and blood pressure pills into [facility] . Please advise.

## 2012-07-02 NOTE — Patient Instructions (Signed)
, °

## 2012-07-09 ENCOUNTER — Encounter: Payer: BC Managed Care – PPO | Admitting: Family Medicine

## 2012-08-25 ENCOUNTER — Encounter: Payer: Self-pay | Admitting: Family Medicine

## 2012-08-25 ENCOUNTER — Ambulatory Visit (INDEPENDENT_AMBULATORY_CARE_PROVIDER_SITE_OTHER): Payer: BC Managed Care – PPO | Admitting: Family Medicine

## 2012-08-25 VITALS — BP 130/90 | Temp 98.1°F | Ht 70.5 in | Wt 248.0 lb

## 2012-08-25 DIAGNOSIS — M545 Low back pain: Secondary | ICD-10-CM

## 2012-08-25 DIAGNOSIS — E785 Hyperlipidemia, unspecified: Secondary | ICD-10-CM

## 2012-08-25 DIAGNOSIS — I10 Essential (primary) hypertension: Secondary | ICD-10-CM

## 2012-08-25 MED ORDER — ATORVASTATIN CALCIUM 80 MG PO TABS: 80.0000 mg | ORAL_TABLET | Freq: Every day | ORAL | Status: DC

## 2012-08-25 MED ORDER — LISINOPRIL 20 MG PO TABS: 20.0000 mg | ORAL_TABLET | Freq: Every day | ORAL | Status: DC

## 2012-08-25 NOTE — Patient Instructions (Signed)
Continue current medications  Return in one year sooner if any problems 

## 2012-08-25 NOTE — Progress Notes (Signed)
  Subjective:    Patient ID: Bruce Terry, male    DOB: 01-02-1971, 42 y.o.   MRN: [id number]  HPI Bruce Terry is a 42 year old married male nonsmoker who comes in today for general physical examination because of a history of hypertension hyperlipidemia and back pain  He takes 80 mg Lipitor daily for hyperlipidemia lipids are at goal. 2 of his LFTs are slightly elevated but not elevated enough that I would change his medication  He takes lisinopril 20 mg daily for hypertension BP 130/90  He's currently on Percocet 5 mg 2 tabs 4 times a day when necessary for back pain from Dr. Ollen Bowl at Dr. Cassandria Santee office. He says he has a L3-L4 disc rupture. Surgery is anticipated for November   Review of Systems  Constitutional: Negative.   HENT: Negative.   Eyes: Negative.   Respiratory: Negative.   Cardiovascular: Negative.   Gastrointestinal: Negative.   Genitourinary: Negative.   Musculoskeletal: Negative.   Skin: Negative.   Neurological: Negative.   Psychiatric/Behavioral: Negative.        Objective:   Physical Exam  Constitutional: He is oriented to person, place, and time. He appears well-developed and well-nourished.  HENT:  Head: Normocephalic and atraumatic.  Right Ear: External ear normal.  Left Ear: External ear normal.  Nose: Nose normal.  Mouth/Throat: Oropharynx is clear and moist.  Eyes: Conjunctivae and EOM are normal. Pupils are equal, round, and reactive to light.  Neck: Normal range of motion. Neck supple. No JVD present. No tracheal deviation present. No thyromegaly present.  Cardiovascular: Normal rate, regular rhythm, normal heart sounds and intact distal pulses.  Exam reveals no gallop and no friction rub.   No murmur heard. Pulmonary/Chest: Effort normal and breath sounds normal. No stridor. No respiratory distress. He has no wheezes. He has no rales. He exhibits no tenderness.  Abdominal: Soft. Bowel sounds are normal. He exhibits no distension and no mass. There  is no tenderness. There is no rebound and no guarding.  Genitourinary: Rectum normal, prostate normal and penis normal. Guaiac negative stool. No penile tenderness.  Musculoskeletal: Normal range of motion. He exhibits no edema and no tenderness.  Lymphadenopathy:    He has no cervical adenopathy.  Neurological: He is alert and oriented to person, place, and time. He has normal reflexes. No cranial nerve deficit. He exhibits normal muscle tone.  Skin: Skin is warm and dry. No rash noted. No erythema. No pallor.  Psychiatric: He has a normal mood and affect. His behavior is normal. Judgment and thought content normal.          Assessment & Plan:  Healthy male  Hyperlipidemia continue Lipitor and aspirin  Hypertension continue lisinopril 20 mg daily  Back pain step in today to ruptured disc L3-L4 followup by neurosurgeon

## 2012-11-30 ENCOUNTER — Other Ambulatory Visit: Payer: Self-pay | Admitting: Anesthesiology

## 2012-11-30 DIAGNOSIS — IMO0002 Reserved for concepts with insufficient information to code with codable children: Secondary | ICD-10-CM

## 2012-12-29 ENCOUNTER — Other Ambulatory Visit: Payer: BC Managed Care – PPO

## 2013-11-22 ENCOUNTER — Other Ambulatory Visit: Payer: Self-pay | Admitting: Neurosurgery

## 2013-11-22 DIAGNOSIS — M47817 Spondylosis without myelopathy or radiculopathy, lumbosacral region: Secondary | ICD-10-CM

## 2013-11-26 ENCOUNTER — Other Ambulatory Visit: Payer: Self-pay | Admitting: Family Medicine

## 2014-01-10 ENCOUNTER — Other Ambulatory Visit: Payer: BC Managed Care – PPO

## 2014-01-13 ENCOUNTER — Ambulatory Visit
Admission: RE | Admit: 2014-01-13 | Discharge: 2014-01-13 | Disposition: A | Discharge status: Home / Self Care | Payer: BC Managed Care – PPO | Source: Ambulatory Visit | Attending: Neurosurgery | Admitting: Neurosurgery

## 2014-01-13 ENCOUNTER — Other Ambulatory Visit: Payer: BC Managed Care – PPO

## 2014-01-13 DIAGNOSIS — M47817 Spondylosis without myelopathy or radiculopathy, lumbosacral region: Secondary | ICD-10-CM

## 2014-07-15 ENCOUNTER — Other Ambulatory Visit: Payer: Self-pay | Admitting: Family Medicine

## 2014-07-25 ENCOUNTER — Other Ambulatory Visit: Payer: Self-pay | Admitting: Neurosurgery

## 2014-07-25 DIAGNOSIS — M47816 Spondylosis without myelopathy or radiculopathy, lumbar region: Secondary | ICD-10-CM

## 2014-07-27 ENCOUNTER — Ambulatory Visit
Admission: RE | Admit: 2014-07-27 | Discharge: 2014-07-27 | Disposition: A | Discharge status: Home / Self Care | Payer: Self-pay | Source: Ambulatory Visit | Attending: Neurosurgery | Admitting: Neurosurgery

## 2014-07-27 ENCOUNTER — Encounter (INDEPENDENT_AMBULATORY_CARE_PROVIDER_SITE_OTHER): Payer: Self-pay

## 2014-07-27 DIAGNOSIS — M47816 Spondylosis without myelopathy or radiculopathy, lumbar region: Secondary | ICD-10-CM

## 2014-08-12 ENCOUNTER — Other Ambulatory Visit: Payer: Self-pay | Admitting: Neurosurgery

## 2014-08-12 DIAGNOSIS — R5381 Other malaise: Secondary | ICD-10-CM

## 2014-09-08 ENCOUNTER — Encounter: Payer: Self-pay | Admitting: Family Medicine

## 2014-09-08 ENCOUNTER — Ambulatory Visit (INDEPENDENT_AMBULATORY_CARE_PROVIDER_SITE_OTHER): Payer: BLUE CROSS/BLUE SHIELD | Admitting: Family Medicine

## 2014-09-08 VITALS — Temp 98.7°F | Wt 268.0 lb

## 2014-09-08 DIAGNOSIS — I10 Essential (primary) hypertension: Secondary | ICD-10-CM

## 2014-09-08 LAB — HEPATIC FUNCTION PANEL
ALBUMIN: 4.8 g/dL (ref 3.5–5.2)
ALT: 56 U/L — ABNORMAL HIGH (ref 0–53)
AST: 28 U/L (ref 0–37)
Alkaline Phosphatase: 106 U/L (ref 39–117)
BILIRUBIN TOTAL: 0.5 mg/dL (ref 0.2–1.2)
Bilirubin, Direct: 0.1 mg/dL (ref 0.0–0.3)
Total Protein: 7.1 g/dL (ref 6.0–8.3)

## 2014-09-08 LAB — CBC WITH DIFFERENTIAL/PLATELET
BASOS PCT: 0.9 % (ref 0.0–3.0)
Basophils Absolute: 0 10*3/uL (ref 0.0–0.1)
EOS PCT: 9.5 % — AB (ref 0.0–5.0)
Eosinophils Absolute: 0.5 10*3/uL (ref 0.0–0.7)
HEMATOCRIT: 44.1 % (ref 39.0–52.0)
HEMOGLOBIN: 15.2 g/dL (ref 13.0–17.0)
LYMPHS ABS: 2.3 10*3/uL (ref 0.7–4.0)
Lymphocytes Relative: 40.8 % (ref 12.0–46.0)
MCHC: 34.4 g/dL (ref 30.0–36.0)
MCV: 85.9 fl (ref 78.0–100.0)
Monocytes Absolute: 0.6 10*3/uL (ref 0.1–1.0)
Monocytes Relative: 10.2 % (ref 3.0–12.0)
NEUTROS ABS: 2.2 10*3/uL (ref 1.4–7.7)
Neutrophils Relative %: 38.6 % — ABNORMAL LOW (ref 43.0–77.0)
Platelets: 218 10*3/uL (ref 150.0–400.0)
RBC: 5.13 Mil/uL (ref 4.22–5.81)
RDW: 13.1 % (ref 11.5–15.5)
WBC: 5.6 10*3/uL (ref 4.0–10.5)

## 2014-09-08 LAB — BASIC METABOLIC PANEL
BUN: 14 mg/dL (ref 6–23)
CALCIUM: 9.6 mg/dL (ref 8.4–10.5)
CO2: 30 meq/L (ref 19–32)
CREATININE: 0.8 mg/dL (ref 0.40–1.50)
Chloride: 103 mEq/L (ref 96–112)
GFR: 111.36 mL/min (ref >60.00)
Glucose, Bld: 108 mg/dL — ABNORMAL HIGH (ref 70–99)
Potassium: 4.3 mEq/L (ref 3.5–5.1)
Sodium: 141 mEq/L (ref 135–145)

## 2014-09-08 LAB — TSH: TSH: 1.03 u[IU]/mL (ref 0.35–4.50)

## 2014-09-08 LAB — LIPID PANEL
Cholesterol: 151 mg/dL (ref 0–200)
HDL: 38.5 mg/dL — ABNORMAL LOW (ref >39.00)
NonHDL: 112.5
TRIGLYCERIDES: 230 mg/dL — AB (ref 0.0–149.0)
Total CHOL/HDL Ratio: 4
VLDL: 46 mg/dL — ABNORMAL HIGH (ref 0.0–40.0)

## 2014-09-08 LAB — POCT URINALYSIS DIPSTICK
Bilirubin, UA: NEGATIVE
Glucose, UA: NEGATIVE
Ketones, UA: NEGATIVE
Leukocytes, UA: NEGATIVE
Nitrite, UA: NEGATIVE
PROTEIN UA: NEGATIVE
RBC UA: NEGATIVE
Spec Grav, UA: >=1.03
UROBILINOGEN UA: 0.2
pH, UA: 5

## 2014-09-08 LAB — LDL CHOLESTEROL, DIRECT: LDL DIRECT: 93 mg/dL

## 2014-09-08 MED ORDER — ATORVASTATIN CALCIUM 80 MG PO TABS: 80.0000 mg | ORAL_TABLET | Freq: Every day | ORAL | Status: DC

## 2014-09-08 MED ORDER — LISINOPRIL 20 MG PO TABS: 20.0000 mg | ORAL_TABLET | Freq: Every day | ORAL | Status: DC

## 2014-09-08 NOTE — Progress Notes (Signed)
Pre visit review using our clinic review tool, if applicable. No additional management support is needed unless otherwise documented below in the visit note. 

## 2014-09-08 NOTE — Patient Instructions (Signed)
Restart your blood pressure medication,,,,,,,,,,, lisinopril 20 mg take 2 now when you get it filled then starting tomorrow 1 tablet daily in the morning  Check your blood pressure daily in the morning for the next 3 weeks to be sure it drops back to normal,,,,,,,,,,, 135/85 or less  Labs today,,,,,,,,, we will call you if there is anything abnormal,,,,,,,,,, set up a 30 minute appointment in May for physical exam

## 2014-09-08 NOTE — Progress Notes (Signed)
   Subjective:    Patient ID: Bruce Terry, male    DOB: Jun 13, 1970, 44 y.o.   MRN: [id number]  HPI Bruce Terry is a 44 year old married male nonsmoker who comes in today for evaluation of high blood pressure. He was last year in 2014. He ran out of his medicine about 2 weeks ago. On his medication his blood pressure is 120/80. Today is 180/110  He has a lumbar back brace on because he's had 3 back operations in the past 6 months. Neurosurgeon Dr. Carloyn Manner. The last was a spinal fusion  He also takes Lipitor 80 mg daily for hyperlipidemia.  2 groin has some on Percocet and Neurontin   Review of Systems Review of systems otherwise negative    Objective:   Physical Exam  Well-developed well-nourished male no acute distress vital signs stable he is afebrile except BP 180/100 right arm sitting position      Assessment & Plan:  Hypertension secondary to being out of medication,,,,, restart medication,,,,,,,,,, check labs,,,,,, set up for CPX,

## 2014-09-27 ENCOUNTER — Encounter: Payer: Self-pay | Admitting: Family Medicine

## 2014-09-27 ENCOUNTER — Ambulatory Visit (INDEPENDENT_AMBULATORY_CARE_PROVIDER_SITE_OTHER): Payer: BLUE CROSS/BLUE SHIELD | Admitting: Family Medicine

## 2014-09-27 VITALS — BP 120/80 | Temp 98.3°F | Wt 263.0 lb

## 2014-09-27 DIAGNOSIS — E785 Hyperlipidemia, unspecified: Secondary | ICD-10-CM | POA: Diagnosis not present

## 2014-09-27 DIAGNOSIS — I10 Essential (primary) hypertension: Secondary | ICD-10-CM

## 2014-09-27 NOTE — Patient Instructions (Signed)
Continue current medications  Follow-up the fourth week in April 2017 for your physical examination..........Marland Kitchen with [facility] our new adult nurse practitioner

## 2014-09-27 NOTE — Progress Notes (Signed)
Pre visit review using our clinic review tool, if applicable. No additional management support is needed unless otherwise documented below in the visit note. 

## 2014-09-27 NOTE — Progress Notes (Signed)
   Subjective:    Patient ID: Bruce Terry, male    DOB: 1971/01/09, 44 y.o.   MRN: [id number]  HPI Bruce Terry is a 44 year old married male nonsmoker who comes in today for follow-up of hypertension and hyperlipidemia  He's monitoring his blood pressure at home was 120/80 on lisinopril 20 mg daily  He also takes Lipitor 80 mg daily and lipids are normal.  He has chronic back  pain is been seeing his neurosurgeon on a regular basis. He's on a combination of Neurontin and Percocet from his neurosurgeon   Review of Systems Review of systems otherwise negative    Objective:   Physical Exam Well-developed well-nourished male no acute distress vital signs stable he is afebrile BP 120/80 at home,,,,,,,,, higher here when he initially came in because his severe pain       Assessment & Plan:  Hypertension at goal.....Marland Kitchen continue current therapy follow-up physical exam next spring  Hyperlipidemia at goal......Marland Kitchen

## 2015-03-23 ENCOUNTER — Other Ambulatory Visit: Payer: Self-pay | Admitting: Neurosurgery

## 2015-03-23 DIAGNOSIS — M47816 Spondylosis without myelopathy or radiculopathy, lumbar region: Secondary | ICD-10-CM

## 2015-03-24 ENCOUNTER — Ambulatory Visit
Admission: RE | Admit: 2015-03-24 | Discharge: 2015-03-24 | Disposition: A | Discharge status: Home / Self Care | Payer: BLUE CROSS/BLUE SHIELD | Source: Ambulatory Visit | Attending: Neurosurgery | Admitting: Neurosurgery

## 2015-03-24 DIAGNOSIS — M47816 Spondylosis without myelopathy or radiculopathy, lumbar region: Secondary | ICD-10-CM

## 2015-05-02 ENCOUNTER — Other Ambulatory Visit: Payer: Self-pay | Admitting: Family Medicine

## 2015-05-02 DIAGNOSIS — I10 Essential (primary) hypertension: Secondary | ICD-10-CM

## 2015-05-02 MED ORDER — LISINOPRIL 20 MG PO TABS: 20.0000 mg | ORAL_TABLET | Freq: Every day | ORAL | Status: DC

## 2015-05-02 MED ORDER — ATORVASTATIN CALCIUM 80 MG PO TABS: 80.0000 mg | ORAL_TABLET | Freq: Every day | ORAL | Status: DC

## 2015-07-17 ENCOUNTER — Other Ambulatory Visit: Payer: Self-pay | Admitting: Neurosurgery

## 2015-07-17 DIAGNOSIS — M5116 Intervertebral disc disorders with radiculopathy, lumbar region: Secondary | ICD-10-CM

## 2015-07-24 ENCOUNTER — Ambulatory Visit
Admission: RE | Admit: 2015-07-24 | Discharge: 2015-07-24 | Disposition: A | Discharge status: Home / Self Care | Payer: BLUE CROSS/BLUE SHIELD | Source: Ambulatory Visit | Attending: Neurosurgery | Admitting: Neurosurgery

## 2015-07-24 DIAGNOSIS — M5116 Intervertebral disc disorders with radiculopathy, lumbar region: Secondary | ICD-10-CM

## 2015-09-06 DIAGNOSIS — Z01818 Encounter for other preprocedural examination: Secondary | ICD-10-CM | POA: Diagnosis not present

## 2015-09-06 DIAGNOSIS — I10 Essential (primary) hypertension: Secondary | ICD-10-CM | POA: Diagnosis not present

## 2015-09-06 DIAGNOSIS — Z79899 Other long term (current) drug therapy: Secondary | ICD-10-CM | POA: Diagnosis not present

## 2015-09-06 DIAGNOSIS — M5116 Intervertebral disc disorders with radiculopathy, lumbar region: Secondary | ICD-10-CM | POA: Diagnosis not present

## 2015-09-06 DIAGNOSIS — J9811 Atelectasis: Secondary | ICD-10-CM | POA: Diagnosis not present

## 2015-09-06 DIAGNOSIS — E7800 Pure hypercholesterolemia, unspecified: Secondary | ICD-10-CM | POA: Diagnosis not present

## 2015-09-11 DIAGNOSIS — M5116 Intervertebral disc disorders with radiculopathy, lumbar region: Secondary | ICD-10-CM | POA: Diagnosis not present

## 2015-09-11 DIAGNOSIS — M4727 Other spondylosis with radiculopathy, lumbosacral region: Secondary | ICD-10-CM | POA: Diagnosis not present

## 2015-09-11 DIAGNOSIS — M47816 Spondylosis without myelopathy or radiculopathy, lumbar region: Secondary | ICD-10-CM | POA: Diagnosis not present

## 2015-09-11 DIAGNOSIS — I10 Essential (primary) hypertension: Secondary | ICD-10-CM | POA: Diagnosis not present

## 2015-09-11 DIAGNOSIS — Z79899 Other long term (current) drug therapy: Secondary | ICD-10-CM | POA: Diagnosis not present

## 2015-09-11 DIAGNOSIS — E7800 Pure hypercholesterolemia, unspecified: Secondary | ICD-10-CM | POA: Diagnosis not present

## 2015-09-11 DIAGNOSIS — M4327 Fusion of spine, lumbosacral region: Secondary | ICD-10-CM | POA: Diagnosis not present

## 2015-09-11 DIAGNOSIS — M5126 Other intervertebral disc displacement, lumbar region: Secondary | ICD-10-CM | POA: Diagnosis not present

## 2015-09-11 DIAGNOSIS — T84226A Displacement of internal fixation device of vertebrae, initial encounter: Secondary | ICD-10-CM | POA: Diagnosis not present

## 2015-09-11 DIAGNOSIS — Z79891 Long term (current) use of opiate analgesic: Secondary | ICD-10-CM | POA: Diagnosis not present

## 2015-09-11 DIAGNOSIS — Z981 Arthrodesis status: Secondary | ICD-10-CM | POA: Diagnosis not present

## 2015-09-11 DIAGNOSIS — M5117 Intervertebral disc disorders with radiculopathy, lumbosacral region: Secondary | ICD-10-CM | POA: Diagnosis not present

## 2015-09-13 DIAGNOSIS — M5126 Other intervertebral disc displacement, lumbar region: Secondary | ICD-10-CM | POA: Diagnosis not present

## 2015-09-21 DIAGNOSIS — M5116 Intervertebral disc disorders with radiculopathy, lumbar region: Secondary | ICD-10-CM | POA: Diagnosis not present

## 2015-09-21 DIAGNOSIS — M545 Low back pain: Secondary | ICD-10-CM | POA: Diagnosis not present

## 2015-10-23 ENCOUNTER — Other Ambulatory Visit: Payer: Self-pay | Admitting: Neurosurgery

## 2015-10-23 DIAGNOSIS — M47816 Spondylosis without myelopathy or radiculopathy, lumbar region: Secondary | ICD-10-CM

## 2015-10-26 ENCOUNTER — Ambulatory Visit
Admission: RE | Admit: 2015-10-26 | Discharge: 2015-10-26 | Disposition: A | Discharge status: Home / Self Care | Payer: BLUE CROSS/BLUE SHIELD | Source: Ambulatory Visit | Attending: Neurosurgery | Admitting: Neurosurgery

## 2015-10-26 DIAGNOSIS — M47816 Spondylosis without myelopathy or radiculopathy, lumbar region: Secondary | ICD-10-CM

## 2015-10-26 DIAGNOSIS — M545 Low back pain: Secondary | ICD-10-CM | POA: Diagnosis not present

## 2015-10-29 ENCOUNTER — Other Ambulatory Visit: Payer: Self-pay | Admitting: Family Medicine

## 2016-02-20 DIAGNOSIS — M6281 Muscle weakness (generalized): Secondary | ICD-10-CM | POA: Diagnosis not present

## 2016-02-20 DIAGNOSIS — S4991XA Unspecified injury of right shoulder and upper arm, initial encounter: Secondary | ICD-10-CM | POA: Diagnosis not present

## 2016-02-20 DIAGNOSIS — X509XXA Other and unspecified overexertion or strenuous movements or postures, initial encounter: Secondary | ICD-10-CM | POA: Diagnosis not present

## 2016-02-20 DIAGNOSIS — M7581 Other shoulder lesions, right shoulder: Secondary | ICD-10-CM | POA: Diagnosis not present

## 2016-02-20 DIAGNOSIS — M25511 Pain in right shoulder: Secondary | ICD-10-CM | POA: Diagnosis not present

## 2016-02-21 ENCOUNTER — Other Ambulatory Visit: Payer: Self-pay | Admitting: Orthopedic Surgery

## 2016-02-21 DIAGNOSIS — M25511 Pain in right shoulder: Secondary | ICD-10-CM

## 2016-02-27 ENCOUNTER — Ambulatory Visit
Admission: RE | Admit: 2016-02-27 | Discharge: 2016-02-27 | Disposition: A | Discharge status: Home / Self Care | Payer: BLUE CROSS/BLUE SHIELD | Source: Ambulatory Visit | Attending: Orthopedic Surgery | Admitting: Orthopedic Surgery

## 2016-02-27 DIAGNOSIS — M25511 Pain in right shoulder: Secondary | ICD-10-CM

## 2016-03-05 DIAGNOSIS — M5116 Intervertebral disc disorders with radiculopathy, lumbar region: Secondary | ICD-10-CM | POA: Diagnosis not present

## 2016-03-05 DIAGNOSIS — M47816 Spondylosis without myelopathy or radiculopathy, lumbar region: Secondary | ICD-10-CM | POA: Diagnosis not present

## 2016-03-05 DIAGNOSIS — G5710 Meralgia paresthetica, unspecified lower limb: Secondary | ICD-10-CM | POA: Diagnosis not present

## 2016-03-13 DIAGNOSIS — M7541 Impingement syndrome of right shoulder: Secondary | ICD-10-CM | POA: Diagnosis not present

## 2016-03-13 DIAGNOSIS — M75111 Incomplete rotator cuff tear or rupture of right shoulder, not specified as traumatic: Secondary | ICD-10-CM | POA: Diagnosis not present

## 2016-03-13 DIAGNOSIS — M19011 Primary osteoarthritis, right shoulder: Secondary | ICD-10-CM | POA: Diagnosis not present

## 2016-03-13 DIAGNOSIS — M24111 Other articular cartilage disorders, right shoulder: Secondary | ICD-10-CM | POA: Diagnosis not present

## 2016-03-13 DIAGNOSIS — G8918 Other acute postprocedural pain: Secondary | ICD-10-CM | POA: Diagnosis not present

## 2016-03-19 ENCOUNTER — Other Ambulatory Visit: Payer: Self-pay | Admitting: Family Medicine

## 2016-03-19 ENCOUNTER — Other Ambulatory Visit: Payer: Self-pay | Admitting: Emergency Medicine

## 2016-03-19 MED ORDER — LISINOPRIL 20 MG PO TABS: 20.0000 mg | ORAL_TABLET | Freq: Every day | ORAL | 3 refills | Status: DC

## 2016-06-11 DIAGNOSIS — G5710 Meralgia paresthetica, unspecified lower limb: Secondary | ICD-10-CM | POA: Diagnosis not present

## 2016-06-11 DIAGNOSIS — M47816 Spondylosis without myelopathy or radiculopathy, lumbar region: Secondary | ICD-10-CM | POA: Diagnosis not present

## 2016-06-11 DIAGNOSIS — M5116 Intervertebral disc disorders with radiculopathy, lumbar region: Secondary | ICD-10-CM | POA: Diagnosis not present

## 2016-07-17 DIAGNOSIS — M47816 Spondylosis without myelopathy or radiculopathy, lumbar region: Secondary | ICD-10-CM | POA: Diagnosis not present

## 2016-07-17 DIAGNOSIS — G5710 Meralgia paresthetica, unspecified lower limb: Secondary | ICD-10-CM | POA: Diagnosis not present

## 2016-07-19 ENCOUNTER — Other Ambulatory Visit: Payer: Self-pay | Admitting: Neurosurgery

## 2016-07-19 DIAGNOSIS — M47816 Spondylosis without myelopathy or radiculopathy, lumbar region: Secondary | ICD-10-CM

## 2016-07-22 ENCOUNTER — Other Ambulatory Visit: Payer: BLUE CROSS/BLUE SHIELD

## 2016-07-22 ENCOUNTER — Ambulatory Visit
Admission: RE | Admit: 2016-07-22 | Discharge: 2016-07-22 | Disposition: A | Discharge status: Home / Self Care | Payer: BLUE CROSS/BLUE SHIELD | Source: Ambulatory Visit | Attending: Neurosurgery | Admitting: Neurosurgery

## 2016-07-22 DIAGNOSIS — M47816 Spondylosis without myelopathy or radiculopathy, lumbar region: Secondary | ICD-10-CM

## 2016-07-22 DIAGNOSIS — M545 Low back pain: Secondary | ICD-10-CM | POA: Diagnosis not present

## 2016-07-24 DIAGNOSIS — G5710 Meralgia paresthetica, unspecified lower limb: Secondary | ICD-10-CM | POA: Diagnosis not present

## 2016-07-24 DIAGNOSIS — M47816 Spondylosis without myelopathy or radiculopathy, lumbar region: Secondary | ICD-10-CM | POA: Diagnosis not present

## 2016-07-24 DIAGNOSIS — G894 Chronic pain syndrome: Secondary | ICD-10-CM | POA: Diagnosis not present

## 2016-08-21 DIAGNOSIS — G5710 Meralgia paresthetica, unspecified lower limb: Secondary | ICD-10-CM | POA: Diagnosis not present

## 2016-08-21 DIAGNOSIS — M5116 Intervertebral disc disorders with radiculopathy, lumbar region: Secondary | ICD-10-CM | POA: Diagnosis not present

## 2016-08-21 DIAGNOSIS — G894 Chronic pain syndrome: Secondary | ICD-10-CM | POA: Diagnosis not present

## 2016-08-21 DIAGNOSIS — M47816 Spondylosis without myelopathy or radiculopathy, lumbar region: Secondary | ICD-10-CM | POA: Diagnosis not present

## 2016-08-28 ENCOUNTER — Other Ambulatory Visit: Payer: Self-pay | Admitting: Family Medicine

## 2016-08-28 DIAGNOSIS — I10 Essential (primary) hypertension: Secondary | ICD-10-CM

## 2016-08-28 NOTE — Telephone Encounter (Signed)
Denied. Needs an appointment.

## 2016-09-24 DIAGNOSIS — M5416 Radiculopathy, lumbar region: Secondary | ICD-10-CM | POA: Diagnosis not present

## 2016-09-24 DIAGNOSIS — M961 Postlaminectomy syndrome, not elsewhere classified: Secondary | ICD-10-CM | POA: Diagnosis not present

## 2016-09-30 ENCOUNTER — Encounter: Payer: Self-pay | Admitting: Family Medicine

## 2016-10-18 DIAGNOSIS — M47816 Spondylosis without myelopathy or radiculopathy, lumbar region: Secondary | ICD-10-CM | POA: Diagnosis not present

## 2016-12-16 DIAGNOSIS — G894 Chronic pain syndrome: Secondary | ICD-10-CM | POA: Diagnosis not present

## 2016-12-16 DIAGNOSIS — M47816 Spondylosis without myelopathy or radiculopathy, lumbar region: Secondary | ICD-10-CM | POA: Diagnosis not present

## 2017-01-27 DIAGNOSIS — Z01818 Encounter for other preprocedural examination: Secondary | ICD-10-CM | POA: Diagnosis not present

## 2017-01-27 DIAGNOSIS — M47816 Spondylosis without myelopathy or radiculopathy, lumbar region: Secondary | ICD-10-CM | POA: Diagnosis not present

## 2017-01-27 DIAGNOSIS — G894 Chronic pain syndrome: Secondary | ICD-10-CM | POA: Diagnosis not present

## 2017-01-27 DIAGNOSIS — I10 Essential (primary) hypertension: Secondary | ICD-10-CM | POA: Diagnosis not present

## 2017-01-27 DIAGNOSIS — T84296A Other mechanical complication of internal fixation device of vertebrae, initial encounter: Secondary | ICD-10-CM | POA: Diagnosis not present

## 2017-01-27 DIAGNOSIS — Z981 Arthrodesis status: Secondary | ICD-10-CM | POA: Diagnosis not present

## 2017-01-27 DIAGNOSIS — Z79899 Other long term (current) drug therapy: Secondary | ICD-10-CM | POA: Diagnosis not present

## 2017-01-27 DIAGNOSIS — E7800 Pure hypercholesterolemia, unspecified: Secondary | ICD-10-CM | POA: Diagnosis not present

## 2017-01-30 DIAGNOSIS — M47816 Spondylosis without myelopathy or radiculopathy, lumbar region: Secondary | ICD-10-CM | POA: Diagnosis not present

## 2017-01-30 DIAGNOSIS — Z79899 Other long term (current) drug therapy: Secondary | ICD-10-CM | POA: Diagnosis not present

## 2017-01-30 DIAGNOSIS — I10 Essential (primary) hypertension: Secondary | ICD-10-CM | POA: Diagnosis not present

## 2017-01-30 DIAGNOSIS — Z981 Arthrodesis status: Secondary | ICD-10-CM | POA: Diagnosis not present

## 2017-01-30 DIAGNOSIS — E7800 Pure hypercholesterolemia, unspecified: Secondary | ICD-10-CM | POA: Diagnosis not present

## 2017-01-30 DIAGNOSIS — G894 Chronic pain syndrome: Secondary | ICD-10-CM | POA: Diagnosis not present

## 2017-01-30 DIAGNOSIS — Z472 Encounter for removal of internal fixation device: Secondary | ICD-10-CM | POA: Diagnosis not present

## 2017-01-30 DIAGNOSIS — T84296A Other mechanical complication of internal fixation device of vertebrae, initial encounter: Secondary | ICD-10-CM | POA: Diagnosis not present

## 2017-04-17 DIAGNOSIS — M47816 Spondylosis without myelopathy or radiculopathy, lumbar region: Secondary | ICD-10-CM | POA: Diagnosis not present

## 2017-04-17 DIAGNOSIS — M7061 Trochanteric bursitis, right hip: Secondary | ICD-10-CM | POA: Diagnosis not present

## 2017-04-17 DIAGNOSIS — Z6837 Body mass index (BMI) 37.0-37.9, adult: Secondary | ICD-10-CM | POA: Diagnosis not present

## 2017-05-07 ENCOUNTER — Other Ambulatory Visit: Payer: Self-pay | Admitting: Neurosurgery

## 2017-05-07 DIAGNOSIS — M7061 Trochanteric bursitis, right hip: Secondary | ICD-10-CM

## 2017-05-08 ENCOUNTER — Ambulatory Visit
Admission: RE | Admit: 2017-05-08 | Discharge: 2017-05-08 | Disposition: A | Discharge status: Home / Self Care | Payer: BLUE CROSS/BLUE SHIELD | Source: Ambulatory Visit | Attending: Neurosurgery | Admitting: Neurosurgery

## 2017-05-08 DIAGNOSIS — M25551 Pain in right hip: Secondary | ICD-10-CM | POA: Diagnosis not present

## 2017-05-08 DIAGNOSIS — M7061 Trochanteric bursitis, right hip: Secondary | ICD-10-CM

## 2017-07-03 ENCOUNTER — Telehealth: Payer: Self-pay

## 2017-07-03 NOTE — Telephone Encounter (Signed)
Copied from [place] [id number]. Topic: General - Other >> Jul 03, 2017  2:29 PM Carolyn Stare wrote:  Pt is needing to get his bp med refilled and thinks he will need a physical before Dr Sherren Mocha will refill . Dr Sherren Mocha schedule is block please contact pt about an appt for a physical   [phone]   >> Jul 03, 2017  2:42 PM Nimmons, Emilio Math, RN wrote: Patient last seen Dr. Sherren Mocha on 09/27/2014

## 2017-07-03 NOTE — Telephone Encounter (Signed)
Called pt left a message on pt voicemail to return my call in the office regarding his request on making an appointment for a physical.

## 2017-07-03 NOTE — Telephone Encounter (Signed)
Copied from [place] [id number]. Topic: General - Other >> Jul 03, 2017  2:29 PM Carolyn Stare wrote:  Pt is needing to get his bp med refilled and thinks he will need a physical before Dr Sherren Mocha will refill . Dr Sherren Mocha schedule is block please contact pt about an appt for a physical   [phone]   >> Jul 03, 2017  2:42 PM Nimmons, Emilio Math, RN wrote: Patient last seen Dr. Sherren Mocha on 09/27/2014

## 2017-07-04 ENCOUNTER — Telehealth: Payer: Self-pay

## 2017-07-04 NOTE — Telephone Encounter (Signed)
Called pt left a message for pt to return my call in the office regarding his request for refills on Lisinopril and scheduling an OV.

## 2017-07-14 ENCOUNTER — Other Ambulatory Visit: Payer: Self-pay

## 2017-07-14 MED ORDER — LISINOPRIL 20 MG PO TABS: 20.0000 mg | ORAL_TABLET | Freq: Every day | ORAL | 0 refills | Status: DC

## 2017-07-14 NOTE — Telephone Encounter (Signed)
Spoke with pt scheduled him for an Office Visit on 07/21/2017 at 10.15 am for  medication refill.

## 2017-07-14 NOTE — Telephone Encounter (Signed)
Refill for Lisinopril has been sent to the pharmacy for refill. Pt has been scheduled to see Dr Sherren Mocha for an office visit

## 2017-07-21 ENCOUNTER — Encounter: Payer: Self-pay | Admitting: Family Medicine

## 2017-07-21 ENCOUNTER — Ambulatory Visit (INDEPENDENT_AMBULATORY_CARE_PROVIDER_SITE_OTHER): Payer: BLUE CROSS/BLUE SHIELD | Admitting: Family Medicine

## 2017-07-21 VITALS — BP 158/98 | HR 96 | Temp 98.2°F | Wt 274.0 lb

## 2017-07-21 DIAGNOSIS — I10 Essential (primary) hypertension: Secondary | ICD-10-CM | POA: Diagnosis not present

## 2017-07-21 DIAGNOSIS — E785 Hyperlipidemia, unspecified: Secondary | ICD-10-CM | POA: Diagnosis not present

## 2017-07-21 LAB — CBC WITH DIFFERENTIAL/PLATELET
Basophils Absolute: 0 10*3/uL (ref 0.0–0.1)
Basophils Relative: 0.7 % (ref 0.0–3.0)
EOS PCT: 7.4 % — AB (ref 0.0–5.0)
Eosinophils Absolute: 0.5 10*3/uL (ref 0.0–0.7)
HCT: 46.2 % (ref 39.0–52.0)
HEMOGLOBIN: 16.3 g/dL (ref 13.0–17.0)
LYMPHS ABS: 2.9 10*3/uL (ref 0.7–4.0)
Lymphocytes Relative: 41.9 % (ref 12.0–46.0)
MCHC: 35.2 g/dL (ref 30.0–36.0)
MCV: 87.2 fl (ref 78.0–100.0)
MONO ABS: 0.6 10*3/uL (ref 0.1–1.0)
Monocytes Relative: 8.9 % (ref 3.0–12.0)
NEUTROS PCT: 41.1 % — AB (ref 43.0–77.0)
Neutro Abs: 2.9 10*3/uL (ref 1.4–7.7)
Platelets: 242 10*3/uL (ref 150.0–400.0)
RBC: 5.3 Mil/uL (ref 4.22–5.81)
RDW: 12.8 % (ref 11.5–15.5)
WBC: 7 10*3/uL (ref 4.0–10.5)

## 2017-07-21 LAB — POCT URINALYSIS DIPSTICK
BILIRUBIN UA: NEGATIVE
GLUCOSE UA: NEGATIVE
Ketones, UA: NEGATIVE
Leukocytes, UA: NEGATIVE
Nitrite, UA: NEGATIVE
Odor: NEGATIVE
PH UA: 6 (ref 5.0–8.0)
Protein, UA: NEGATIVE
RBC UA: NEGATIVE
SPEC GRAV UA: 1.025 (ref 1.010–1.025)
UROBILINOGEN UA: 0.2 U/dL

## 2017-07-21 LAB — HEPATIC FUNCTION PANEL
ALT: 51 U/L (ref 0–53)
AST: 27 U/L (ref 0–37)
Albumin: 4.7 g/dL (ref 3.5–5.2)
Alkaline Phosphatase: 67 U/L (ref 39–117)
BILIRUBIN TOTAL: 0.3 mg/dL (ref 0.2–1.2)
Bilirubin, Direct: 0.1 mg/dL (ref 0.0–0.3)
Total Protein: 7.5 g/dL (ref 6.0–8.3)

## 2017-07-21 LAB — BASIC METABOLIC PANEL
BUN: 15 mg/dL (ref 6–23)
CHLORIDE: 105 meq/L (ref 96–112)
CO2: 29 meq/L (ref 19–32)
CREATININE: 1.17 mg/dL (ref 0.40–1.50)
Calcium: 10.1 mg/dL (ref 8.4–10.5)
GFR: 70.91 mL/min (ref >60.00)
Glucose, Bld: 97 mg/dL (ref 70–99)
Potassium: 4.4 mEq/L (ref 3.5–5.1)
Sodium: 141 mEq/L (ref 135–145)

## 2017-07-21 LAB — LDL CHOLESTEROL, DIRECT: LDL DIRECT: 129 mg/dL

## 2017-07-21 LAB — LIPID PANEL
Cholesterol: 195 mg/dL (ref 0–200)
HDL: 38.4 mg/dL — ABNORMAL LOW (ref >39.00)
NonHDL: 156.65
Total CHOL/HDL Ratio: 5
Triglycerides: 237 mg/dL — ABNORMAL HIGH (ref 0.0–149.0)
VLDL: 47.4 mg/dL — ABNORMAL HIGH (ref 0.0–40.0)

## 2017-07-21 MED ORDER — ATORVASTATIN CALCIUM 80 MG PO TABS: 80.0000 mg | ORAL_TABLET | Freq: Every day | ORAL | 4 refills | Status: DC

## 2017-07-21 MED ORDER — LISINOPRIL 20 MG PO TABS: 20.0000 mg | ORAL_TABLET | Freq: Every day | ORAL | 4 refills | Status: DC

## 2017-07-21 NOTE — Patient Instructions (Signed)
Lisinopril 20 mg.........Marland Kitchen 1 daily in the morning  Lipitor 80 mg......... One daily at bedtime with a baby aspirin  Labs today  Set up a time in the next 4-6 weeks for general physical exam
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
## 2017-07-22 LAB — TSH: TSH: 1.09 u[IU]/mL (ref 0.35–4.50)

## 2017-07-22 LAB — PSA: PSA: 0.7 ng/mL (ref 0.10–4.00)

## 2017-07-29 DIAGNOSIS — M47816 Spondylosis without myelopathy or radiculopathy, lumbar region: Secondary | ICD-10-CM | POA: Diagnosis not present

## 2017-07-30 ENCOUNTER — Other Ambulatory Visit: Payer: Self-pay | Admitting: Neurosurgery

## 2017-07-30 DIAGNOSIS — M47816 Spondylosis without myelopathy or radiculopathy, lumbar region: Secondary | ICD-10-CM

## 2017-08-13 ENCOUNTER — Ambulatory Visit
Admission: RE | Admit: 2017-08-13 | Discharge: 2017-08-13 | Disposition: A | Discharge status: Home / Self Care | Payer: BLUE CROSS/BLUE SHIELD | Source: Ambulatory Visit | Attending: Neurosurgery | Admitting: Neurosurgery

## 2017-08-13 DIAGNOSIS — M47816 Spondylosis without myelopathy or radiculopathy, lumbar region: Secondary | ICD-10-CM

## 2017-08-13 DIAGNOSIS — M48061 Spinal stenosis, lumbar region without neurogenic claudication: Secondary | ICD-10-CM | POA: Diagnosis not present

## 2017-08-18 ENCOUNTER — Ambulatory Visit (INDEPENDENT_AMBULATORY_CARE_PROVIDER_SITE_OTHER): Payer: BLUE CROSS/BLUE SHIELD | Admitting: Family Medicine

## 2017-08-18 ENCOUNTER — Encounter: Payer: Self-pay | Admitting: Family Medicine

## 2017-08-18 VITALS — BP 140/90 | HR 78 | Temp 98.1°F | Ht 71.0 in | Wt 273.1 lb

## 2017-08-18 DIAGNOSIS — Z8371 Family history of colonic polyps: Secondary | ICD-10-CM

## 2017-08-18 DIAGNOSIS — Z0000 Encounter for general adult medical examination without abnormal findings: Secondary | ICD-10-CM

## 2017-08-18 DIAGNOSIS — E785 Hyperlipidemia, unspecified: Secondary | ICD-10-CM

## 2017-08-18 DIAGNOSIS — I10 Essential (primary) hypertension: Secondary | ICD-10-CM

## 2017-08-18 NOTE — Progress Notes (Signed)
Bruce Terry is a 47 year old married male nonsmoker who comes in today for evaluation of hypertension and hyperlipidemia  For hypertension he takes lisinopril 20 mg daily. BP at home 120/70. BP here today 140/90 however he's in severe pain. He's had 5 back operations the last 5 years. His last operation was a spinal fusion. However it has not helped the pain. It's gotten worse. Last work he had an MRI. He is to see his neurosurgeon Dr. Carloyn Manner tomorrow for follow-up. He takes no pain medication except the oxycodone when necessary  He takes Lipitor 80 mg daily and a baby aspirin for hyperlipidemia.  He gets routine eye care, dental care, colonoscopy not until 26 however he tells me his mother has: Had colon polyps. We'll set him up a consult in GI to discuss screening colonoscopy because of the family history  Vaccinations up-to-date  14 point review of systems otherwise negative  EKG was done before his last back operation 8 months ago was normal therefore not repeated. Cardiovascular-wise he's asymptomatic.  BP 140/90 (BP Location: Right Arm, Patient Position: Sitting, Cuff Size: Large)   Pulse 78   Temp 98.1 F (36.7 C) (Oral)   Ht 5\' 11"  (1.803 m)   Wt 273 lb 1.6 oz (123.9 kg)   BMI 38.09 kg/m  Well-developed well-nourished male no acute distress HEENT were negative neck was supple thyroid is not enlarged no carotid bruits cardiopulmonary exam normal abdominal exam normal doesn't a normal circumcised male extremities normal skin normal peripheral pulses normal except for lesion on his left anterior forearm. It's red with a central peak consistent with an actinic keratosis. Advised return for removal ASAP  .Marland Kitchen #1 hypertension at goal........ Continue current therapy  #2 hyperlipidemia......... Continue current therapy  #3 chronic low back pain status post surgery 5 in the last 5 years now with recurrent pain.......... Follow-up with neurosurgeon Dr. Glenna Fellows  #4 abnormal lesion left  forearm........ Clinically it appears to be an AK........Marland Kitchen According the patient's increasing in size...Marland KitchenMarland KitchenMarland Kitchen Therefore return for removal ASAP

## 2017-08-18 NOTE — Patient Instructions (Signed)
Continue current medications  Follow-up in one year for general physical examination  Return sometime in the next week or 2 for removal of the lesion in your left forearm as we discussed.

## 2017-08-19 DIAGNOSIS — Z6838 Body mass index (BMI) 38.0-38.9, adult: Secondary | ICD-10-CM | POA: Diagnosis not present

## 2017-08-19 DIAGNOSIS — M47816 Spondylosis without myelopathy or radiculopathy, lumbar region: Secondary | ICD-10-CM | POA: Diagnosis not present

## 2017-09-01 ENCOUNTER — Ambulatory Visit: Payer: BLUE CROSS/BLUE SHIELD | Admitting: Family Medicine

## 2017-09-08 ENCOUNTER — Encounter: Payer: Self-pay | Admitting: Family Medicine

## 2017-09-08 ENCOUNTER — Ambulatory Visit (INDEPENDENT_AMBULATORY_CARE_PROVIDER_SITE_OTHER): Payer: BLUE CROSS/BLUE SHIELD | Admitting: Family Medicine

## 2017-09-08 VITALS — BP 132/90 | HR 80 | Temp 98.3°F | Wt 272.0 lb

## 2017-09-08 DIAGNOSIS — L570 Actinic keratosis: Secondary | ICD-10-CM | POA: Diagnosis not present

## 2017-09-08 DIAGNOSIS — L821 Other seborrheic keratosis: Secondary | ICD-10-CM | POA: Diagnosis not present

## 2017-09-08 NOTE — Progress Notes (Signed)
Kemar is a 47 year old married male nonsmoker who comes in today for removal of an abnormal mole  The lesion is 8 mm's by 8 mm on his left forearm. It's red and irritated and is been increasing in size  After informed consent lesion was cleaned with alcohol, anesthetized with 1% Xylocaine with epinephrine, remove a 2 mm margins. The base was cauterized Band-Aid was applied. The lesion was sent for pathologic analysis.  Clinically it appears to be an irritated AK  BP 132/90 (BP Location: Left Arm, Patient Position: Sitting, Cuff Size: Large)   Pulse 80   Temp 98.3 F (36.8 C) (Oral)   Wt 272 lb (123.4 kg)   BMI 37.94 kg/m  Well-developed well-nourished male no acute distress vital signs stable he is afebrile  Procedures above  #1 irritated AK left forearm...Marland KitchenMarland KitchenMarland Kitchen sent for pathologic analysis to rule out skin cancer

## 2017-09-08 NOTE — Patient Instructions (Signed)
Remove the Band-Aids tomorrow  Within 2 weeks we will call you the report............ if in 2 weeks we do not call you call us.

## 2017-09-17 ENCOUNTER — Encounter: Payer: Self-pay | Admitting: Family Medicine

## 2017-11-25 DIAGNOSIS — M47816 Spondylosis without myelopathy or radiculopathy, lumbar region: Secondary | ICD-10-CM | POA: Diagnosis not present

## 2017-11-25 DIAGNOSIS — Z6837 Body mass index (BMI) 37.0-37.9, adult: Secondary | ICD-10-CM | POA: Diagnosis not present

## 2018-03-04 DIAGNOSIS — Z6837 Body mass index (BMI) 37.0-37.9, adult: Secondary | ICD-10-CM | POA: Diagnosis not present

## 2018-03-04 DIAGNOSIS — M47816 Spondylosis without myelopathy or radiculopathy, lumbar region: Secondary | ICD-10-CM | POA: Diagnosis not present

## 2018-03-11 ENCOUNTER — Other Ambulatory Visit: Payer: Self-pay | Admitting: Neurosurgery

## 2018-03-11 DIAGNOSIS — M47816 Spondylosis without myelopathy or radiculopathy, lumbar region: Secondary | ICD-10-CM

## 2018-03-19 ENCOUNTER — Ambulatory Visit
Admission: RE | Admit: 2018-03-19 | Discharge: 2018-03-19 | Disposition: A | Discharge status: Home / Self Care | Payer: BLUE CROSS/BLUE SHIELD | Source: Ambulatory Visit | Attending: Neurosurgery | Admitting: Neurosurgery

## 2018-03-19 DIAGNOSIS — M545 Low back pain: Secondary | ICD-10-CM | POA: Diagnosis not present

## 2018-03-19 DIAGNOSIS — M47816 Spondylosis without myelopathy or radiculopathy, lumbar region: Secondary | ICD-10-CM

## 2018-03-30 DIAGNOSIS — M47816 Spondylosis without myelopathy or radiculopathy, lumbar region: Secondary | ICD-10-CM | POA: Diagnosis not present

## 2018-03-30 DIAGNOSIS — Z6838 Body mass index (BMI) 38.0-38.9, adult: Secondary | ICD-10-CM | POA: Diagnosis not present

## 2018-08-24 ENCOUNTER — Encounter: Payer: BLUE CROSS/BLUE SHIELD | Admitting: Family Medicine

## 2018-10-06 DIAGNOSIS — M961 Postlaminectomy syndrome, not elsewhere classified: Secondary | ICD-10-CM | POA: Diagnosis not present

## 2018-11-05 DIAGNOSIS — M961 Postlaminectomy syndrome, not elsewhere classified: Secondary | ICD-10-CM | POA: Diagnosis not present

## 2018-11-05 DIAGNOSIS — M5416 Radiculopathy, lumbar region: Secondary | ICD-10-CM | POA: Diagnosis not present

## 2018-11-05 DIAGNOSIS — I10 Essential (primary) hypertension: Secondary | ICD-10-CM | POA: Diagnosis not present

## 2018-11-05 DIAGNOSIS — Z6837 Body mass index (BMI) 37.0-37.9, adult: Secondary | ICD-10-CM | POA: Diagnosis not present

## 2018-12-03 DIAGNOSIS — R419 Unspecified symptoms and signs involving cognitive functions and awareness: Secondary | ICD-10-CM | POA: Diagnosis not present

## 2018-12-16 DIAGNOSIS — I10 Essential (primary) hypertension: Secondary | ICD-10-CM | POA: Diagnosis not present

## 2018-12-16 DIAGNOSIS — M5416 Radiculopathy, lumbar region: Secondary | ICD-10-CM | POA: Diagnosis not present

## 2018-12-16 DIAGNOSIS — M961 Postlaminectomy syndrome, not elsewhere classified: Secondary | ICD-10-CM | POA: Diagnosis not present

## 2018-12-16 DIAGNOSIS — M5137 Other intervertebral disc degeneration, lumbosacral region: Secondary | ICD-10-CM | POA: Diagnosis not present

## 2019-03-16 DIAGNOSIS — Z6835 Body mass index (BMI) 35.0-35.9, adult: Secondary | ICD-10-CM | POA: Diagnosis not present

## 2019-03-16 DIAGNOSIS — M961 Postlaminectomy syndrome, not elsewhere classified: Secondary | ICD-10-CM | POA: Diagnosis not present

## 2019-03-16 DIAGNOSIS — I10 Essential (primary) hypertension: Secondary | ICD-10-CM | POA: Diagnosis not present

## 2019-03-16 DIAGNOSIS — M5416 Radiculopathy, lumbar region: Secondary | ICD-10-CM | POA: Diagnosis not present

## 2019-06-15 DIAGNOSIS — Z6836 Body mass index (BMI) 36.0-36.9, adult: Secondary | ICD-10-CM | POA: Diagnosis not present

## 2019-06-15 DIAGNOSIS — R030 Elevated blood-pressure reading, without diagnosis of hypertension: Secondary | ICD-10-CM | POA: Diagnosis not present

## 2019-09-03 DIAGNOSIS — M961 Postlaminectomy syndrome, not elsewhere classified: Secondary | ICD-10-CM | POA: Diagnosis not present

## 2019-09-03 DIAGNOSIS — M5416 Radiculopathy, lumbar region: Secondary | ICD-10-CM | POA: Diagnosis not present

## 2019-11-30 DIAGNOSIS — M961 Postlaminectomy syndrome, not elsewhere classified: Secondary | ICD-10-CM | POA: Diagnosis not present

## 2019-11-30 DIAGNOSIS — M5137 Other intervertebral disc degeneration, lumbosacral region: Secondary | ICD-10-CM | POA: Diagnosis not present

## 2019-11-30 DIAGNOSIS — M5416 Radiculopathy, lumbar region: Secondary | ICD-10-CM | POA: Diagnosis not present

## 2020-01-13 ENCOUNTER — Encounter: Payer: Self-pay | Admitting: Gastroenterology

## 2020-03-02 DIAGNOSIS — M961 Postlaminectomy syndrome, not elsewhere classified: Secondary | ICD-10-CM | POA: Diagnosis not present

## 2020-03-02 DIAGNOSIS — M5416 Radiculopathy, lumbar region: Secondary | ICD-10-CM | POA: Diagnosis not present

## 2020-04-03 DIAGNOSIS — H25813 Combined forms of age-related cataract, bilateral: Secondary | ICD-10-CM | POA: Diagnosis not present

## 2020-04-03 DIAGNOSIS — H527 Unspecified disorder of refraction: Secondary | ICD-10-CM | POA: Diagnosis not present

## 2020-04-03 DIAGNOSIS — H52203 Unspecified astigmatism, bilateral: Secondary | ICD-10-CM | POA: Diagnosis not present

## 2020-04-18 DIAGNOSIS — M961 Postlaminectomy syndrome, not elsewhere classified: Secondary | ICD-10-CM | POA: Diagnosis not present

## 2020-04-18 DIAGNOSIS — F1120 Opioid dependence, uncomplicated: Secondary | ICD-10-CM | POA: Diagnosis not present

## 2020-04-18 DIAGNOSIS — M5416 Radiculopathy, lumbar region: Secondary | ICD-10-CM | POA: Diagnosis not present

## 2020-05-13 HISTORY — PX: EYE SURGERY: SHX253

## 2020-05-18 DIAGNOSIS — I10 Essential (primary) hypertension: Secondary | ICD-10-CM | POA: Diagnosis not present

## 2020-05-18 DIAGNOSIS — H25811 Combined forms of age-related cataract, right eye: Secondary | ICD-10-CM | POA: Diagnosis not present

## 2020-05-18 DIAGNOSIS — E669 Obesity, unspecified: Secondary | ICD-10-CM | POA: Diagnosis not present

## 2020-05-19 DIAGNOSIS — Z961 Presence of intraocular lens: Secondary | ICD-10-CM | POA: Diagnosis not present

## 2020-05-19 DIAGNOSIS — H25812 Combined forms of age-related cataract, left eye: Secondary | ICD-10-CM | POA: Diagnosis not present

## 2020-06-01 DIAGNOSIS — Z6836 Body mass index (BMI) 36.0-36.9, adult: Secondary | ICD-10-CM | POA: Diagnosis not present

## 2020-06-01 DIAGNOSIS — H52203 Unspecified astigmatism, bilateral: Secondary | ICD-10-CM | POA: Diagnosis not present

## 2020-06-01 DIAGNOSIS — H25812 Combined forms of age-related cataract, left eye: Secondary | ICD-10-CM | POA: Diagnosis not present

## 2020-06-01 DIAGNOSIS — Z961 Presence of intraocular lens: Secondary | ICD-10-CM | POA: Diagnosis not present

## 2020-06-01 DIAGNOSIS — E669 Obesity, unspecified: Secondary | ICD-10-CM | POA: Diagnosis not present

## 2020-06-01 DIAGNOSIS — G8929 Other chronic pain: Secondary | ICD-10-CM | POA: Diagnosis not present

## 2020-06-01 DIAGNOSIS — Z9841 Cataract extraction status, right eye: Secondary | ICD-10-CM | POA: Diagnosis not present

## 2020-06-01 DIAGNOSIS — I10 Essential (primary) hypertension: Secondary | ICD-10-CM | POA: Diagnosis not present

## 2020-06-01 DIAGNOSIS — Z885 Allergy status to narcotic agent status: Secondary | ICD-10-CM | POA: Diagnosis not present

## 2020-06-02 DIAGNOSIS — H52203 Unspecified astigmatism, bilateral: Secondary | ICD-10-CM | POA: Diagnosis not present

## 2020-06-02 DIAGNOSIS — Z961 Presence of intraocular lens: Secondary | ICD-10-CM | POA: Diagnosis not present

## 2020-06-02 DIAGNOSIS — H527 Unspecified disorder of refraction: Secondary | ICD-10-CM | POA: Diagnosis not present

## 2020-06-23 ENCOUNTER — Encounter: Payer: Self-pay | Admitting: Gastroenterology

## 2020-07-25 DIAGNOSIS — M961 Postlaminectomy syndrome, not elsewhere classified: Secondary | ICD-10-CM | POA: Diagnosis not present

## 2020-07-25 DIAGNOSIS — F1120 Opioid dependence, uncomplicated: Secondary | ICD-10-CM | POA: Diagnosis not present

## 2020-07-25 DIAGNOSIS — M5416 Radiculopathy, lumbar region: Secondary | ICD-10-CM | POA: Diagnosis not present

## 2020-09-18 ENCOUNTER — Telehealth: Payer: Self-pay | Admitting: Family Medicine

## 2020-09-18 NOTE — Telephone Encounter (Signed)
The patients wife called to see if her husband can establish care with you. Her husband used to be a Dr. Sherren Mocha patient.  Can I schedule this patient?

## 2020-09-19 NOTE — Telephone Encounter (Signed)
LMVM for the patient to contact the office to schedule a new patient appointment with [name]

## 2020-10-10 ENCOUNTER — Encounter: Payer: Self-pay | Admitting: Adult Health

## 2020-10-10 ENCOUNTER — Ambulatory Visit (INDEPENDENT_AMBULATORY_CARE_PROVIDER_SITE_OTHER): Payer: BC Managed Care – PPO | Admitting: Adult Health

## 2020-10-10 ENCOUNTER — Other Ambulatory Visit: Payer: Self-pay

## 2020-10-10 VITALS — BP 160/100 | HR 84 | Temp 98.2°F | Ht 70.5 in | Wt 290.0 lb

## 2020-10-10 DIAGNOSIS — E785 Hyperlipidemia, unspecified: Secondary | ICD-10-CM

## 2020-10-10 DIAGNOSIS — I10 Essential (primary) hypertension: Secondary | ICD-10-CM

## 2020-10-10 DIAGNOSIS — Z7689 Persons encountering health services in other specified circumstances: Secondary | ICD-10-CM

## 2020-10-10 MED ORDER — LISINOPRIL 20 MG PO TABS: 20.0000 mg | ORAL_TABLET | Freq: Every day | ORAL | 0 refills | Status: DC

## 2020-10-10 MED ORDER — ATORVASTATIN CALCIUM 80 MG PO TABS: 80.0000 mg | ORAL_TABLET | Freq: Every day | ORAL | 0 refills | Status: DC

## 2020-10-10 NOTE — Progress Notes (Signed)
Patient presents to clinic today to establish care. 50 year old male who  has a past medical history of Hemorrhoid, Hyperlipidemia, Hypertension, and Low back pain.  Previous patient of Dr. Sherren Mocha. Was last seen in 2019.   Acute Concerns: Establish Care   Chronic Issues: Hyperlipidemia - was prescribed Lipitor 80 mg in the past   Lab Results  Component Value Date   CHOL 195 07/21/2017   HDL 38.40 (L) 07/21/2017   LDLCALC 109 (H) 07/02/2012   LDLDIRECT 129.0 07/21/2017   TRIG 237.0 (H) 07/21/2017   CHOLHDL 5 07/21/2017   Hypertension - was prescribed lisinopril 20 mg in the past. Reports that he has not taken this medication in " along time". He reports " I just don't feel good and I know it  BP Readings from Last 3 Encounters:  10/10/20 (!) 160/100  09/08/17 132/90  08/18/17 140/90   Chronic back pain -has had 5 total spine surgeries.  Is followed by [facility], last seen in March 2022.  Pain has been increasing especially in his back and right leg.  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  Currently prescribed oxycodone 10 mg.   Health Maintenance: Dental -- Routine  Vision -- Routine  Immunizations -- Colonoscopy -- 2008 - is due.    Past Medical History:  Diagnosis Date  . Hemorrhoid   . Hyperlipidemia   . Hypertension   . Low back pain     History reviewed. No pertinent surgical history.  Current Outpatient Medications on File Prior to Visit  Medication Sig Dispense Refill  . atorvastatin (LIPITOR) 80 MG tablet Take 1 tablet (80 mg total) by mouth daily. (Patient not taking: Reported on 10/10/2020) 90 tablet 4  . lisinopril (PRINIVIL,ZESTRIL) 20 MG tablet Take 1 tablet (20 mg total) by mouth at bedtime. (Patient not taking: Reported on 10/10/2020) 100 tablet 4  . oxyCODONE-acetaminophen (PERCOCET) 10-325 MG tablet  (Patient not taking: Reported on  10/10/2020)     No current facility-administered medications on file prior to visit.    Allergies  Allergen Reactions  . Morphine And Related     GI upset    Family History  Problem Relation Age of Onset  . Alcohol abuse Father   . Hypertension Mother   . Cirrhosis Mother   . Diabetes Mother   . Hypothyroidism Mother   . Obesity Mother   . Kidney disease Mother     Social History   Socioeconomic History  . Marital status: Married    Spouse name: Not on file  . Number of children: Not on file  . Years of education: Not on file  . Highest education level: Not on file  Occupational History  . Not on file  Tobacco Use  . Smoking status: Never Smoker  . Smokeless tobacco: Never Used  Substance and Sexual Activity  . Alcohol use: No  . Drug use: No  . Sexual activity: Not on file  Other Topics Concern  . Not on file  Social History Narrative  . Not on file   Social Determinants of Health   Financial Resource Strain: Not on file  Food Insecurity: Not on file  Transportation Needs: Not on file  Physical Activity: Not on file  Stress: Not on file  Social Connections: Not on file  Intimate Partner Violence: Not on file    Review of Systems  Constitutional: Negative.   HENT: Negative.   Respiratory: Negative.   Cardiovascular: Negative.   Gastrointestinal: Negative.   Genitourinary: Negative.   Musculoskeletal: Positive for back pain, joint pain and neck pain.  Skin: Negative.   Neurological: Negative.   Endo/Heme/Allergies: Negative.   Psychiatric/Behavioral: Negative.   All other systems reviewed and are negative.   BP (!) 160/100   Pulse 84   Temp 98.2 F (36.8 C) (Oral)   Ht 5' 10.5" (1.791 m)   Wt 290 lb (131.5 kg)   SpO2 96%   BMI 41.02 kg/m   Physical Exam Vitals and nursing note reviewed.  Constitutional:      General: He is not in acute distress.    Appearance: Normal appearance. He is well-developed. He is obese.  HENT:     Head:  Normocephalic and atraumatic.     Right Ear: Tympanic membrane, ear canal and external ear normal. There is no impacted cerumen.     Left Ear: Tympanic membrane, ear canal and external ear normal. There is no impacted cerumen.     Nose: Nose normal. No congestion or rhinorrhea.     Mouth/Throat:     Mouth: Mucous membranes are moist.     Pharynx: Oropharynx is clear. No oropharyngeal exudate or posterior oropharyngeal erythema.  Eyes:     General:        Right eye: No discharge.        Left eye: No discharge.     Extraocular Movements: Extraocular movements intact.     Conjunctiva/sclera: Conjunctivae normal.     Pupils: Pupils are equal, round, and reactive to light.  Neck:     Vascular: No carotid bruit.     Trachea: No tracheal deviation.  Cardiovascular:     Rate and Rhythm: Normal rate and regular rhythm.     Pulses: Normal pulses.     Heart sounds: Normal heart sounds. No murmur heard. No friction rub. No gallop.   Pulmonary:     Effort: Pulmonary effort is normal. No respiratory distress.     Breath sounds: Normal breath sounds. No stridor. No wheezing, rhonchi or rales.  Chest:     Chest wall: No tenderness.  Abdominal:     General: Bowel sounds are normal. There is no distension.     Palpations: Abdomen is soft. There is no mass.     Tenderness: There is no abdominal tenderness. There is no right CVA tenderness, left CVA tenderness, guarding or rebound.     Hernia: No hernia is present.  Musculoskeletal:        General: No swelling, tenderness, deformity or signs of injury. Normal range of motion.     Right lower leg: No edema.     Left lower leg: No edema.  Lymphadenopathy:     Cervical: No cervical adenopathy.  Skin:    General: Skin is warm and dry.     Capillary Refill: Capillary refill takes less than 2 seconds.     Coloration: Skin is not jaundiced or pale.     Findings: No bruising, erythema, lesion or rash.  Neurological:     General: No focal deficit  present.     Mental Status: He is alert and oriented to person, place, and time.     Cranial Nerves: No cranial nerve deficit.     Sensory: No sensory deficit.     Motor: No weakness.     Coordination: Coordination normal.     Gait: Gait normal.  Deep Tendon Reflexes: Reflexes normal.  Psychiatric:        Mood and Affect: Mood normal.        Behavior: Behavior normal.        Thought Content: Thought content normal.        Judgment: Judgment normal.      Assessment/Plan: 1. Encounter to establish care - Follow up for CPE in one month  - Will have him restart his medications  - Work on weight loss through diet and exercise   2. Essential hypertension  - lisinopril (ZESTRIL) 20 MG tablet; Take 1 tablet (20 mg total) by mouth at bedtime.  Dispense: 90 tablet; Refill: 0  3. Hyperlipidemia, unspecified hyperlipidemia type  - atorvastatin (LIPITOR) 80 MG tablet; Take 1 tablet (80 mg total) by mouth daily.  Dispense: 90 tablet; Refill: 0   [name], NP

## 2020-10-10 NOTE — Patient Instructions (Addendum)
It was great meeting you today   I have sent in your lisinopril 20 mg and Lipitor 80 mg.   Please follow up in 1 month for your physical exam

## 2020-10-25 DIAGNOSIS — M961 Postlaminectomy syndrome, not elsewhere classified: Secondary | ICD-10-CM | POA: Diagnosis not present

## 2020-10-25 DIAGNOSIS — M5137 Other intervertebral disc degeneration, lumbosacral region: Secondary | ICD-10-CM | POA: Diagnosis not present

## 2020-10-25 DIAGNOSIS — M5416 Radiculopathy, lumbar region: Secondary | ICD-10-CM | POA: Diagnosis not present

## 2020-10-25 DIAGNOSIS — F1120 Opioid dependence, uncomplicated: Secondary | ICD-10-CM | POA: Diagnosis not present

## 2020-11-30 ENCOUNTER — Other Ambulatory Visit: Payer: Self-pay

## 2020-12-01 ENCOUNTER — Ambulatory Visit (INDEPENDENT_AMBULATORY_CARE_PROVIDER_SITE_OTHER): Payer: BC Managed Care – PPO | Admitting: Adult Health

## 2020-12-01 ENCOUNTER — Encounter: Payer: Self-pay | Admitting: Adult Health

## 2020-12-01 VITALS — BP 140/82 | HR 80 | Temp 99.3°F | Ht 70.25 in | Wt 279.0 lb

## 2020-12-01 DIAGNOSIS — Z1211 Encounter for screening for malignant neoplasm of colon: Secondary | ICD-10-CM

## 2020-12-01 DIAGNOSIS — Z23 Encounter for immunization: Secondary | ICD-10-CM

## 2020-12-01 DIAGNOSIS — E785 Hyperlipidemia, unspecified: Secondary | ICD-10-CM

## 2020-12-01 DIAGNOSIS — Z1159 Encounter for screening for other viral diseases: Secondary | ICD-10-CM | POA: Diagnosis not present

## 2020-12-01 DIAGNOSIS — Z125 Encounter for screening for malignant neoplasm of prostate: Secondary | ICD-10-CM

## 2020-12-01 DIAGNOSIS — M545 Low back pain, unspecified: Secondary | ICD-10-CM

## 2020-12-01 DIAGNOSIS — G8929 Other chronic pain: Secondary | ICD-10-CM

## 2020-12-01 DIAGNOSIS — Z114 Encounter for screening for human immunodeficiency virus [HIV]: Secondary | ICD-10-CM

## 2020-12-01 DIAGNOSIS — I10 Essential (primary) hypertension: Secondary | ICD-10-CM | POA: Diagnosis not present

## 2020-12-01 DIAGNOSIS — Z0000 Encounter for general adult medical examination without abnormal findings: Secondary | ICD-10-CM | POA: Diagnosis not present

## 2020-12-01 LAB — CBC WITH DIFFERENTIAL/PLATELET
Basophils Absolute: 0.1 10*3/uL (ref 0.0–0.1)
Basophils Relative: 1.3 % (ref 0.0–3.0)
Eosinophils Absolute: 0.4 10*3/uL (ref 0.0–0.7)
Eosinophils Relative: 6 % — ABNORMAL HIGH (ref 0.0–5.0)
HCT: 45.5 % (ref 39.0–52.0)
Hemoglobin: 15.5 g/dL (ref 13.0–17.0)
Lymphocytes Relative: 41.9 % (ref 12.0–46.0)
Lymphs Abs: 2.8 10*3/uL (ref 0.7–4.0)
MCHC: 34 g/dL (ref 30.0–36.0)
MCV: 90.4 fl (ref 78.0–100.0)
Monocytes Absolute: 0.7 10*3/uL (ref 0.1–1.0)
Monocytes Relative: 9.9 % (ref 3.0–12.0)
Neutro Abs: 2.7 10*3/uL (ref 1.4–7.7)
Neutrophils Relative %: 40.9 % — ABNORMAL LOW (ref 43.0–77.0)
Platelets: 218 10*3/uL (ref 150.0–400.0)
RBC: 5.03 Mil/uL (ref 4.22–5.81)
RDW: 13.1 % (ref 11.5–15.5)
WBC: 6.6 10*3/uL (ref 4.0–10.5)

## 2020-12-01 LAB — COMPREHENSIVE METABOLIC PANEL
ALT: 120 U/L — ABNORMAL HIGH (ref 0–53)
AST: 67 U/L — ABNORMAL HIGH (ref 0–37)
Albumin: 5 g/dL (ref 3.5–5.2)
Alkaline Phosphatase: 66 U/L (ref 39–117)
BUN: 15 mg/dL (ref 6–23)
CO2: 28 mEq/L (ref 19–32)
Calcium: 9.5 mg/dL (ref 8.4–10.5)
Chloride: 99 mEq/L (ref 96–112)
Creatinine, Ser: 0.83 mg/dL (ref 0.40–1.50)
GFR: 101.89 mL/min (ref >60.00)
Glucose, Bld: 105 mg/dL — ABNORMAL HIGH (ref 70–99)
Potassium: 4.4 mEq/L (ref 3.5–5.1)
Sodium: 136 mEq/L (ref 135–145)
Total Bilirubin: 0.9 mg/dL (ref 0.2–1.2)
Total Protein: 7.7 g/dL (ref 6.0–8.3)

## 2020-12-01 LAB — LIPID PANEL
Cholesterol: 235 mg/dL — ABNORMAL HIGH (ref 0–200)
HDL: 43.2 mg/dL (ref >39.00)
LDL Cholesterol: 168 mg/dL — ABNORMAL HIGH (ref 0–99)
NonHDL: 191.77
Total CHOL/HDL Ratio: 5
Triglycerides: 117 mg/dL (ref 0.0–149.0)
VLDL: 23.4 mg/dL (ref 0.0–40.0)

## 2020-12-01 LAB — PSA: PSA: 0.54 ng/mL (ref 0.10–4.00)

## 2020-12-01 LAB — HEMOGLOBIN A1C: Hgb A1c MFr Bld: 6 % (ref 4.6–6.5)

## 2020-12-01 LAB — TSH: TSH: 1.5 u[IU]/mL (ref 0.35–5.50)

## 2020-12-01 NOTE — Addendum Note (Signed)
Addended by: Gwenyth Ober R on: 12/01/2020 12:05 PM   Modules accepted: Orders

## 2020-12-01 NOTE — Patient Instructions (Signed)
It was great seeing you today   Best of luck on Monday   Please follow up in 2 months for a nurse visit to get your second shingles vaccination   We will follow up with you about your blood work

## 2020-12-01 NOTE — Progress Notes (Signed)
Subjective:    Patient ID: Bruce Terry, male    DOB: 1970/07/07, 50 y.o.   MRN: [id number]  HPI Patient presents for yearly preventative medicine examination. He is a pleasant 50 year old male who  has a past medical history of Hemorrhoid, Hyperlipidemia, Hypertension, and Low back pain.  He established with me in May 2022 as a new patient.  Hyperlipidemia -currently prescribed Lipitor 80 mg daily.  He denies myalgia or fatigue Lab Results  Component Value Date   CHOL 195 07/21/2017   HDL 38.40 (L) 07/21/2017   LDLCALC 109 (H) 07/02/2012   LDLDIRECT 129.0 07/21/2017   TRIG 237.0 (H) 07/21/2017   CHOLHDL 5 07/21/2017    Hypertension -scribed lisinopril 20 mg at bedtime.  Denies dizziness, lightheadedness, chest pain, shortness of breath BP Readings from Last 3 Encounters:  12/01/20 140/82  10/10/20 (!) 160/100  09/08/17 132/90    Chronic Back Pain -has had a total of 5 spinal surgeries.  Is followed by [facility].  Reports that his pain has been increasing especially in his back and right leg.  Currently prescribed oxycodone and will use an occasional Flexeril.  He is scheduled for spinal cord stimulator early next week    All immunizations and health maintenance protocols were reviewed with the patient and needed orders were placed.  Appropriate screening laboratory values were ordered for the patient including screening of hyperlipidemia, renal function and hepatic function. If indicated by BPH, a PSA was ordered.  Medication reconciliation,  past medical history, social history, problem list and allergies were reviewed in detail with the patient  Goals were established with regard to weight loss, exercise, and  diet in compliance with medications. He is working on weight loss through diet   Wt Readings from Last 3 Encounters:  12/01/20 279 lb (126.6 kg)  10/10/20 290 lb (131.5 kg)  09/08/17 272 lb (123.4 kg)   He is due for routine colon  cancer screening.  Last colonoscopy in 2008.  His colonoscopy was normal.  Review of Systems  Constitutional: Negative.   HENT: Negative.    Eyes: Negative.   Respiratory: Negative.    Cardiovascular: Negative.   Gastrointestinal: Negative.   Endocrine: Negative.   Genitourinary: Negative.   Musculoskeletal:  Positive for arthralgias and back pain.  Skin: Negative.   Allergic/Immunologic: Negative.   Neurological: Negative.   Hematological: Negative.   Psychiatric/Behavioral: Negative.    All other systems reviewed and are negative.  Past Medical History:  Diagnosis Date   Hemorrhoid    Hyperlipidemia    Hypertension    Low back pain     Social History   Socioeconomic History   Marital status: Married    Spouse name: Not on file   Number of children: Not on file   Years of education: Not on file   Highest education level: Not on file  Occupational History   Not on file  Tobacco Use   Smoking status: Never   Smokeless tobacco: Never  Substance and Sexual Activity   Alcohol use: No   Drug use: No   Sexual activity: Not on file  Other Topics Concern   Not on file  Social History Narrative   Not on file   Social Determinants of Health   Financial Resource Strain: Not on file  Food Insecurity: Not on file  Transportation Needs: Not on file  Physical Activity: Not on file  Stress: Not on file  Social Connections: Not on  file  Intimate Partner Violence: Not on file    History reviewed. No pertinent surgical history.  Family History  Problem Relation Age of Onset   Alcohol abuse Father    Hypertension Mother    Cirrhosis Mother    Diabetes Mother    Hypothyroidism Mother    Obesity Mother    Kidney disease Mother     Allergies  Allergen Reactions   Morphine And Related     GI upset    Current Outpatient Medications on File Prior to Visit  Medication Sig Dispense Refill   atorvastatin (LIPITOR) 80 MG tablet Take 1 tablet (80 mg total) by mouth  daily. 90 tablet 0   lisinopril (ZESTRIL) 20 MG tablet Take 1 tablet (20 mg total) by mouth at bedtime. 90 tablet 0   oxyCODONE-acetaminophen (PERCOCET) 10-325 MG tablet  (Patient not taking: Reported on 10/10/2020)     No current facility-administered medications on file prior to visit.    BP 140/82   Pulse 80   Temp 99.3 F (37.4 C) (Oral)   Ht 5' 10.25" (1.784 m)   Wt 279 lb (126.6 kg)   SpO2 98%   BMI 39.75 kg/m        Objective:   Physical Exam Vitals and nursing note reviewed.  Constitutional:      General: He is not in acute distress.    Appearance: Normal appearance. He is well-developed. He is obese.  HENT:     Head: Normocephalic and atraumatic.     Right Ear: Tympanic membrane, ear canal and external ear normal. There is no impacted cerumen.     Left Ear: Tympanic membrane, ear canal and external ear normal. There is no impacted cerumen.     Nose: Nose normal. No congestion or rhinorrhea.     Mouth/Throat:     Mouth: Mucous membranes are moist.     Pharynx: Oropharynx is clear. No oropharyngeal exudate or posterior oropharyngeal erythema.  Eyes:     General:        Right eye: No discharge.        Left eye: No discharge.     Extraocular Movements: Extraocular movements intact.     Conjunctiva/sclera: Conjunctivae normal.     Pupils: Pupils are equal, round, and reactive to light.  Neck:     Vascular: No carotid bruit.     Trachea: No tracheal deviation.  Cardiovascular:     Rate and Rhythm: Normal rate and regular rhythm.     Pulses: Normal pulses.     Heart sounds: Normal heart sounds. No murmur heard.   No friction rub. No gallop.  Pulmonary:     Effort: Pulmonary effort is normal. No respiratory distress.     Breath sounds: Normal breath sounds. No stridor. No wheezing, rhonchi or rales.  Chest:     Chest wall: No tenderness.  Abdominal:     General: Bowel sounds are normal. There is no distension.     Palpations: Abdomen is soft. There is no mass.      Tenderness: There is no abdominal tenderness. There is no right CVA tenderness, left CVA tenderness, guarding or rebound.     Hernia: No hernia is present.  Musculoskeletal:        General: No swelling, tenderness, deformity or signs of injury. Normal range of motion.     Right lower leg: No edema.     Left lower leg: No edema.  Lymphadenopathy:     Cervical: No cervical adenopathy.  Skin:    General: Skin is warm and dry.     Capillary Refill: Capillary refill takes less than 2 seconds.     Coloration: Skin is not jaundiced or pale.     Findings: No bruising, erythema, lesion or rash.  Neurological:     General: No focal deficit present.     Mental Status: He is alert and oriented to person, place, and time.     Cranial Nerves: No cranial nerve deficit.     Sensory: No sensory deficit.     Motor: No weakness.     Coordination: Coordination normal.     Gait: Gait normal.     Deep Tendon Reflexes: Reflexes normal.  Psychiatric:        Mood and Affect: Mood normal.        Behavior: Behavior normal.        Thought Content: Thought content normal.        Judgment: Judgment normal.      Assessment & Plan:  1. Routine general medical examination at a health care facility  - CBC with Differential/Platelet; Future - Comprehensive metabolic panel; Future - Hemoglobin A1c; Future - Lipid panel; Future - TSH; Future  2. Essential hypertension - slightly elevated today. Will keep an eye on it  - CBC with Differential/Platelet; Future - Comprehensive metabolic panel; Future - Hemoglobin A1c; Future - Lipid panel; Future - TSH; Future  3. Hyperlipidemia, unspecified hyperlipidemia type -  Consider adding zetia  - CBC with Differential/Platelet; Future - Comprehensive metabolic panel; Future - Hemoglobin A1c; Future - Lipid panel; Future - TSH; Future  4. Chronic bilateral low back pain without sciatica   5. Colon cancer screening - He is going to call and schedule his  colonoscopy   6. Need for hepatitis C screening test  - Hep C Antibody; Future  7. Encounter for screening for HIV  - HIV Antibody (routine testing w rflx); Future  8. Prostate cancer screening  - PSA; Future  [name], NP

## 2020-12-04 DIAGNOSIS — G894 Chronic pain syndrome: Secondary | ICD-10-CM | POA: Diagnosis not present

## 2020-12-04 DIAGNOSIS — M961 Postlaminectomy syndrome, not elsewhere classified: Secondary | ICD-10-CM | POA: Diagnosis not present

## 2020-12-04 LAB — HEPATITIS C ANTIBODY
Hepatitis C Ab: NONREACTIVE
SIGNAL TO CUT-OFF: 0.01 (ref <1.00)

## 2020-12-04 LAB — HIV ANTIBODY (ROUTINE TESTING W REFLEX): HIV 1&2 Ab, 4th Generation: NONREACTIVE

## 2020-12-12 DIAGNOSIS — M961 Postlaminectomy syndrome, not elsewhere classified: Secondary | ICD-10-CM | POA: Diagnosis not present

## 2020-12-29 ENCOUNTER — Other Ambulatory Visit: Payer: Self-pay | Admitting: Adult Health

## 2020-12-29 DIAGNOSIS — I10 Essential (primary) hypertension: Secondary | ICD-10-CM

## 2021-01-05 ENCOUNTER — Other Ambulatory Visit: Payer: Self-pay | Admitting: Adult Health

## 2021-01-05 DIAGNOSIS — E785 Hyperlipidemia, unspecified: Secondary | ICD-10-CM

## 2021-02-07 ENCOUNTER — Observation Stay (HOSPITAL_COMMUNITY)
Admission: AD | Admit: 2021-02-07 | Discharge: 2021-02-09 | Disposition: A | Discharge status: Home / Self Care | Payer: BC Managed Care – PPO | Source: Ambulatory Visit | Attending: Neurological Surgery | Admitting: Neurological Surgery

## 2021-02-07 ENCOUNTER — Encounter (HOSPITAL_COMMUNITY): Payer: Self-pay | Admitting: Neurological Surgery

## 2021-02-07 DIAGNOSIS — M79672 Pain in left foot: Secondary | ICD-10-CM | POA: Diagnosis not present

## 2021-02-07 DIAGNOSIS — M79605 Pain in left leg: Secondary | ICD-10-CM | POA: Diagnosis not present

## 2021-02-07 DIAGNOSIS — G8918 Other acute postprocedural pain: Secondary | ICD-10-CM | POA: Diagnosis present

## 2021-02-07 DIAGNOSIS — M5380 Other specified dorsopathies, site unspecified: Secondary | ICD-10-CM | POA: Diagnosis not present

## 2021-02-07 DIAGNOSIS — Z20822 Contact with and (suspected) exposure to covid-19: Secondary | ICD-10-CM | POA: Diagnosis not present

## 2021-02-07 DIAGNOSIS — Z79899 Other long term (current) drug therapy: Secondary | ICD-10-CM | POA: Insufficient documentation

## 2021-02-07 DIAGNOSIS — G894 Chronic pain syndrome: Secondary | ICD-10-CM | POA: Diagnosis not present

## 2021-02-07 DIAGNOSIS — Z9682 Presence of neurostimulator: Secondary | ICD-10-CM | POA: Diagnosis not present

## 2021-02-07 DIAGNOSIS — M961 Postlaminectomy syndrome, not elsewhere classified: Secondary | ICD-10-CM | POA: Diagnosis not present

## 2021-02-07 DIAGNOSIS — I10 Essential (primary) hypertension: Secondary | ICD-10-CM | POA: Insufficient documentation

## 2021-02-07 DIAGNOSIS — M79604 Pain in right leg: Secondary | ICD-10-CM | POA: Diagnosis not present

## 2021-02-07 DIAGNOSIS — M79673 Pain in unspecified foot: Secondary | ICD-10-CM | POA: Diagnosis not present

## 2021-02-07 HISTORY — PX: SPINAL CORD STIMULATOR IMPLANT: SHX2422

## 2021-02-07 MED ORDER — NALOXONE HCL 0.4 MG/ML IJ SOLN: 0.4000 mg | INTRAMUSCULAR | Status: DC | PRN

## 2021-02-07 MED ORDER — SODIUM CHLORIDE 0.9% FLUSH: 9.0000 mL | INTRAVENOUS | Status: DC | PRN

## 2021-02-07 MED ORDER — TIZANIDINE HCL 4 MG PO TABS
4.0000 mg | ORAL_TABLET | Freq: Three times a day (TID) | ORAL | Status: DC | PRN
  Administered 2021-02-07 – 2021-02-08 (×3): 4 mg via ORAL
  Filled 2021-02-07 (×3): qty 1

## 2021-02-07 MED ORDER — ONDANSETRON HCL 4 MG/2ML IJ SOLN: 4.0000 mg | Freq: Four times a day (QID) | INTRAMUSCULAR | Status: DC | PRN

## 2021-02-07 MED ORDER — DIPHENHYDRAMINE HCL 50 MG/ML IJ SOLN: 12.5000 mg | Freq: Four times a day (QID) | INTRAMUSCULAR | Status: DC | PRN

## 2021-02-07 MED ORDER — DIPHENHYDRAMINE HCL 12.5 MG/5ML PO ELIX: 12.5000 mg | ORAL_SOLUTION | Freq: Four times a day (QID) | ORAL | Status: DC | PRN

## 2021-02-07 MED ORDER — ONDANSETRON HCL 4 MG PO TABS: 4.0000 mg | ORAL_TABLET | Freq: Four times a day (QID) | ORAL | Status: DC | PRN

## 2021-02-07 MED ORDER — LISINOPRIL 20 MG PO TABS
20.0000 mg | ORAL_TABLET | Freq: Every day | ORAL | Status: DC
  Administered 2021-02-07 – 2021-02-09 (×2): 20 mg via ORAL
  Filled 2021-02-07 (×2): qty 1

## 2021-02-07 MED ORDER — ACETAMINOPHEN 650 MG RE SUPP: 650.0000 mg | RECTAL | Status: DC | PRN

## 2021-02-07 MED ORDER — KETOROLAC TROMETHAMINE 30 MG/ML IJ SOLN
30.0000 mg | Freq: Once | INTRAMUSCULAR | Status: AC
  Administered 2021-02-07: 30 mg via INTRAVENOUS
  Filled 2021-02-07: qty 1

## 2021-02-07 MED ORDER — SODIUM CHLORIDE 0.9% FLUSH: 3.0000 mL | INTRAVENOUS | Status: DC | PRN

## 2021-02-07 MED ORDER — MENTHOL 3 MG MT LOZG: 1.0000 | LOZENGE | OROMUCOSAL | Status: DC | PRN

## 2021-02-07 MED ORDER — SODIUM CHLORIDE 0.9 % IV SOLN: 250.0000 mL | INTRAVENOUS | Status: DC

## 2021-02-07 MED ORDER — GABAPENTIN 300 MG PO CAPS
300.0000 mg | ORAL_CAPSULE | Freq: Three times a day (TID) | ORAL | Status: DC
  Administered 2021-02-07 – 2021-02-09 (×5): 300 mg via ORAL
  Filled 2021-02-07 (×5): qty 1

## 2021-02-07 MED ORDER — DEXAMETHASONE 4 MG PO TABS
4.0000 mg | ORAL_TABLET | Freq: Four times a day (QID) | ORAL | Status: DC
  Administered 2021-02-08 – 2021-02-09 (×4): 4 mg via ORAL
  Filled 2021-02-07 (×11): qty 1

## 2021-02-07 MED ORDER — ONDANSETRON HCL 4 MG/2ML IJ SOLN
4.0000 mg | Freq: Four times a day (QID) | INTRAMUSCULAR | Status: DC | PRN
Start: 2021-02-07 — End: 2021-02-09

## 2021-02-07 MED ORDER — HYDROMORPHONE 1 MG/ML IV SOLN: INTRAVENOUS | Status: DC

## 2021-02-07 MED ORDER — ATORVASTATIN CALCIUM 80 MG PO TABS
80.0000 mg | ORAL_TABLET | Freq: Every day | ORAL | Status: DC
  Administered 2021-02-07 – 2021-02-09 (×2): 80 mg via ORAL
  Filled 2021-02-07 (×2): qty 1

## 2021-02-07 MED ORDER — HYDROMORPHONE HCL 1 MG/ML IJ SOLN
1.0000 mg | INTRAMUSCULAR | Status: AC
  Administered 2021-02-07: 1 mg via INTRAVENOUS
  Filled 2021-02-07: qty 1

## 2021-02-07 MED ORDER — SODIUM CHLORIDE 0.9% FLUSH
3.0000 mL | Freq: Two times a day (BID) | INTRAVENOUS | Status: DC
  Administered 2021-02-07 – 2021-02-09 (×3): 3 mL via INTRAVENOUS

## 2021-02-07 MED ORDER — ACETAMINOPHEN 325 MG PO TABS: 650.0000 mg | ORAL_TABLET | ORAL | Status: DC | PRN

## 2021-02-07 MED ORDER — DEXAMETHASONE SODIUM PHOSPHATE 10 MG/ML IJ SOLN
4.0000 mg | Freq: Four times a day (QID) | INTRAMUSCULAR | Status: DC
  Administered 2021-02-07 – 2021-02-09 (×4): 4 mg via INTRAVENOUS
  Filled 2021-02-07 (×5): qty 1

## 2021-02-07 MED ORDER — HYDROMORPHONE 1 MG/ML IV SOLN
INTRAVENOUS | Status: DC
  Administered 2021-02-07: 6.5 mg via INTRAVENOUS
  Administered 2021-02-08: 2 mg via INTRAVENOUS
  Administered 2021-02-08: 3.5 mg via INTRAVENOUS
  Administered 2021-02-08: 7.5 mg via INTRAVENOUS
  Administered 2021-02-08: 4 mg via INTRAVENOUS
  Administered 2021-02-08: 2 mg via INTRAVENOUS
  Administered 2021-02-08: 30 mg via INTRAVENOUS
  Administered 2021-02-09: 5.5 mg via INTRAVENOUS
  Filled 2021-02-07: qty 30

## 2021-02-07 MED ORDER — PHENOL 1.4 % MT LIQD: 1.0000 | OROMUCOSAL | Status: DC | PRN

## 2021-02-07 MED ORDER — HYDROMORPHONE 1 MG/ML IV SOLN
INTRAVENOUS | Status: DC
  Administered 2021-02-07: 30 mg via INTRAVENOUS
  Filled 2021-02-07: qty 30

## 2021-02-07 NOTE — Progress Notes (Signed)
Patient seen.  Dr. Ronnald Ramp notes reviewed.  PCA just being implemented, patient has not had any narcotic medicine since admission.  Pain 10/10, still in left foot, medial aspect, some in mid foot.  Patient unable to stay still due to pain.  A/P 50 yo male with acute left foot pain following spinal cord stimulator implant today   Will give hydromorphone 1mg  IV X 1 now. Increase PCA to 0.5mg  q15 minutes, 2mg  max in 1hr. Add zanaflex 4mg  q8hrs for muscle spasms. Continue decadron, tylenol, gabapentin. Will continue to follow. Appreciate Dr. Ronnald Ramp assistance. Plan discussed with patient and wife; anticipate MRI when patient able to tolerate lying supine.

## 2021-02-07 NOTE — H&P (Signed)
Subjective: Patient is a 50 y.o. male who complains of severe L foot pain after SCS placement by Dr Davy Pique today at am outside center.  Onset of symptoms was 6 hours ago, gradually worsening since that time. The pain is rated intense, and is located in the L dorsum of the foot. The pain is described as electrical and occurs intermittently. Symptoms are exacerbated by  everything , esp touching the foot.  Past Medical History:  Diagnosis Date   Hemorrhoid    Hyperlipidemia    Hypertension    Low back pain     History reviewed. No pertinent surgical history.  Allergies  Allergen Reactions   Morphine And Related     GI upset    Social History   Tobacco Use   Smoking status: Never   Smokeless tobacco: Never  Substance Use Topics   Alcohol use: No    Family History  Problem Relation Age of Onset   Alcohol abuse Father    Hypertension Mother    Cirrhosis Mother    Diabetes Mother    Hypothyroidism Mother    Obesity Mother    Kidney disease Mother    Prior to Admission medications   Medication Sig Start Date End Date Taking? Authorizing Provider  atorvastatin (LIPITOR) 80 MG tablet TAKE 1 TABLET BY MOUTH EVERY DAY Patient taking differently: Take 80 mg by mouth daily. 01/05/21   Nafziger, Tommi Rumps, NP  cyclobenzaprine (FLEXERIL) 10 MG tablet Take 10 mg by mouth at bedtime as needed for muscle spasms. 11/26/20   [provider]  lisinopril (ZESTRIL) 20 MG tablet TAKE 1 TABLET BY MOUTH EVERYDAY AT BEDTIME Patient taking differently: Take 20 mg by mouth daily. 01/01/21   Nafziger, Tommi Rumps, NP  Oxycodone HCl 10 MG TABS Take 10 mg by mouth every 6 (six) hours as needed for pain. 01/26/21   [provider]     Review of Systems  Positive ROS: neg  All other systems have been reviewed and were otherwise negative with the exception of those mentioned in the HPI and as above.  Objective: Vital signs in last 24 hours: Temp:  [98 F (36.7 C)] 98 F (36.7 C) (09/28  1518) Pulse Rate:  [89] 89 (09/28 1518) Resp:  [18] 18 (09/28 1518) BP: (156)/(81) 156/81 (09/28 1518) SpO2:  [97 %] 97 % (09/28 1518)  General Appearance: Alert, cooperative, no distress, appears stated age Head: Normocephalic, without obvious abnormality, atraumatic Eyes: PERRL, conjunctiva/corneas clear, EOM's intact  Neck: Supple, symmetrical, trachea midline Back: dressings dry Lungs: respirations unlabored Heart: Regular rate and rhythm Abdomen: Soft Extremities: Extremities normal, atraumatic, no cyanosis or edema Pulses: 2+ and symmetric all extremities Skin: Skin color, texture, turgor normal, no rashes or lesions  NEUROLOGIC:   Mental status: alert and oriented, no aphasia, good attention span, Fund of knowledge/ memory ok Motor Exam - grossly normal though hard to fully assess LLE because of pain Sensory Exam - grossly normal Reflexes:  Coordination - grossly normal Gait - not tested Balance - not tested Cranial Nerves: I: smell Not tested  II: visual acuity  OS: na    OD: na  II: visual fields Full to confrontation  II: pupils Equal, round, reactive to light  III,VII: ptosis None  III,IV,VI: extraocular muscles  Full ROM  V: mastication Normal  V: facial light touch sensation  Normal  V,VII: corneal reflex  Present  VII: facial muscle function - upper  Normal  VII: facial muscle function - lower Normal  VIII: hearing Not tested  IX: soft palate elevation  Normal  IX,X: gag reflex Present  XI: trapezius strength  5/5  XI: sternocleidomastoid strength 5/5  XI: neck flexion strength  5/5  XII: tongue strength  Normal    Data Review Lab Results  Component Value Date   WBC 6.6 12/01/2020   HGB 15.5 12/01/2020   HCT 45.5 12/01/2020   MCV 90.4 12/01/2020   PLT 218.0 12/01/2020   Lab Results  Component Value Date   NA 136 12/01/2020   K 4.4 12/01/2020   CL 99 12/01/2020   CO2 28 12/01/2020   BUN 15 12/01/2020   CREATININE 0.83 12/01/2020   GLUCOSE  105 (H) 12/01/2020   Lab Results  Component Value Date   INR 0.95 07/25/2010    Assessment/Plan: 50 yo with neuropathic pain L foot after SCS by another surgeon today. Unclear etiology. Would love to image with MRI (it is compatible) or CT, but he cannot lie still. Will try to control pain first and then image. Dr Davy Pique to stop by.   Bruce Terry 02/07/2021 3:40 PM

## 2021-02-07 NOTE — Progress Notes (Signed)
Pt arrived to unit in strecther via PTAR , Pt alert/oriented in no apparent distress, Pt seen in room and orientated to equipments. Noted two surgical sites in the back , left flank back side, old drainage marked, and  oozing mid back dressing reinforced with 4x4 guaze/tape

## 2021-02-08 ENCOUNTER — Encounter (HOSPITAL_COMMUNITY)
Admission: AD | Disposition: A | Discharge status: Home / Self Care | Payer: Self-pay | Source: Ambulatory Visit | Attending: Neurological Surgery

## 2021-02-08 ENCOUNTER — Observation Stay (HOSPITAL_COMMUNITY): Payer: BC Managed Care – PPO | Admitting: Certified Registered"

## 2021-02-08 ENCOUNTER — Observation Stay (HOSPITAL_COMMUNITY): Payer: BC Managed Care – PPO

## 2021-02-08 ENCOUNTER — Encounter (HOSPITAL_COMMUNITY): Payer: Self-pay | Admitting: Neurological Surgery

## 2021-02-08 DIAGNOSIS — K649 Unspecified hemorrhoids: Secondary | ICD-10-CM | POA: Diagnosis not present

## 2021-02-08 DIAGNOSIS — M79672 Pain in left foot: Secondary | ICD-10-CM | POA: Diagnosis not present

## 2021-02-08 DIAGNOSIS — M25572 Pain in left ankle and joints of left foot: Secondary | ICD-10-CM | POA: Diagnosis not present

## 2021-02-08 DIAGNOSIS — Z20822 Contact with and (suspected) exposure to covid-19: Secondary | ICD-10-CM | POA: Diagnosis not present

## 2021-02-08 DIAGNOSIS — M545 Low back pain, unspecified: Secondary | ICD-10-CM | POA: Diagnosis not present

## 2021-02-08 DIAGNOSIS — I10 Essential (primary) hypertension: Secondary | ICD-10-CM | POA: Diagnosis not present

## 2021-02-08 DIAGNOSIS — Z79899 Other long term (current) drug therapy: Secondary | ICD-10-CM | POA: Diagnosis not present

## 2021-02-08 DIAGNOSIS — E785 Hyperlipidemia, unspecified: Secondary | ICD-10-CM | POA: Diagnosis not present

## 2021-02-08 HISTORY — PX: RADIOLOGY WITH ANESTHESIA: SHX6223

## 2021-02-08 LAB — SURGICAL PCR SCREEN
MRSA, PCR: NEGATIVE
Staphylococcus aureus: NEGATIVE

## 2021-02-08 LAB — SARS CORONAVIRUS 2 (TAT 6-24 HRS): SARS Coronavirus 2: NEGATIVE

## 2021-02-08 SURGERY — MRI WITH ANESTHESIA
Anesthesia: General

## 2021-02-08 MED ORDER — FENTANYL CITRATE (PF) 100 MCG/2ML IJ SOLN
INTRAMUSCULAR | Status: AC
  Filled 2021-02-08: qty 2

## 2021-02-08 MED ORDER — KETAMINE HCL 50 MG/5ML IJ SOSY
PREFILLED_SYRINGE | INTRAMUSCULAR | Status: AC
  Filled 2021-02-08: qty 5

## 2021-02-08 MED ORDER — PROMETHAZINE HCL 25 MG/ML IJ SOLN: 6.2500 mg | INTRAMUSCULAR | Status: DC | PRN

## 2021-02-08 MED ORDER — ACETAMINOPHEN 10 MG/ML IV SOLN: 1000.0000 mg | Freq: Once | INTRAVENOUS | Status: DC | PRN

## 2021-02-08 MED ORDER — ORAL CARE MOUTH RINSE: 15.0000 mL | Freq: Once | OROMUCOSAL | Status: AC

## 2021-02-08 MED ORDER — DEXMEDETOMIDINE (PRECEDEX) IN NS 20 MCG/5ML (4 MCG/ML) IV SYRINGE
PREFILLED_SYRINGE | INTRAVENOUS | Status: DC | PRN
  Administered 2021-02-08 (×4): 8 ug via INTRAVENOUS

## 2021-02-08 MED ORDER — AMITRIPTYLINE HCL 50 MG PO TABS
50.0000 mg | ORAL_TABLET | Freq: Every day | ORAL | Status: DC
  Administered 2021-02-08: 50 mg via ORAL
  Filled 2021-02-08: qty 1

## 2021-02-08 MED ORDER — LACTATED RINGERS IV SOLN: INTRAVENOUS | Status: DC

## 2021-02-08 MED ORDER — HYDROMORPHONE HCL 1 MG/ML IJ SOLN
1.0000 mg | INTRAMUSCULAR | Status: AC
Start: 2021-02-08 — End: 2021-02-08
  Administered 2021-02-08: 1 mg via INTRAVENOUS
  Filled 2021-02-08: qty 1

## 2021-02-08 MED ORDER — DOCUSATE SODIUM 100 MG PO CAPS
100.0000 mg | ORAL_CAPSULE | Freq: Two times a day (BID) | ORAL | Status: DC
  Administered 2021-02-08: 100 mg via ORAL
  Filled 2021-02-08 (×2): qty 1

## 2021-02-08 MED ORDER — MIDAZOLAM HCL 5 MG/5ML IJ SOLN
INTRAMUSCULAR | Status: DC | PRN
  Administered 2021-02-08: 2 mg via INTRAVENOUS

## 2021-02-08 MED ORDER — CHLORHEXIDINE GLUCONATE 0.12 % MT SOLN: 15.0000 mL | Freq: Once | OROMUCOSAL | Status: AC

## 2021-02-08 MED ORDER — CHLORHEXIDINE GLUCONATE 0.12 % MT SOLN
OROMUCOSAL | Status: AC
  Administered 2021-02-08: 15 mL via OROMUCOSAL
  Filled 2021-02-08: qty 15

## 2021-02-08 MED ORDER — FENTANYL CITRATE (PF) 100 MCG/2ML IJ SOLN
25.0000 ug | INTRAMUSCULAR | Status: DC | PRN
  Administered 2021-02-08 (×2): 50 ug via INTRAVENOUS

## 2021-02-08 MED ORDER — GADOBUTROL 1 MMOL/ML IV SOLN
10.0000 mL | Freq: Once | INTRAVENOUS | Status: AC | PRN
  Administered 2021-02-08: 10 mL via INTRAVENOUS

## 2021-02-08 MED ORDER — FENTANYL CITRATE (PF) 100 MCG/2ML IJ SOLN
INTRAMUSCULAR | Status: DC | PRN
  Administered 2021-02-08: 100 ug via INTRAVENOUS
  Administered 2021-02-08: 50 ug via INTRAVENOUS

## 2021-02-08 NOTE — Progress Notes (Signed)
Pt slightly improved, MRI T/L today under anesthesia.

## 2021-02-08 NOTE — Anesthesia Postprocedure Evaluation (Signed)
Anesthesia Post Note  Patient: Beckam Abdulaziz  Procedure(s) Performed: MRI WITH ANESTHESIA WITH/WITHOUT CONTRAST     Patient location during evaluation: PACU Anesthesia Type: MAC Level of consciousness: awake and alert Pain management: pain level controlled Vital Signs Assessment: post-procedure vital signs reviewed and stable Respiratory status: spontaneous breathing, nonlabored ventilation, respiratory function stable and patient connected to nasal cannula oxygen Cardiovascular status: stable and blood pressure returned to baseline Postop Assessment: no apparent nausea or vomiting Anesthetic complications: no   No notable events documented.  Last Vitals:  Vitals:   02/08/21 1240 02/08/21 1241  BP: (!) 114/98   Pulse: 86 86  Resp: 17 13  Temp:  37.2 C  SpO2: 94% 96%    Last Pain:  Vitals:   02/08/21 1241  TempSrc:   PainSc: 10-Worst pain ever                 Belenda Cruise P Rhyen Mazariego

## 2021-02-08 NOTE — Progress Notes (Signed)
Patient seen, pain improved somewhat, though still unable to move left foot without 10/10 pain.    A/P 50 yo male with acute left foot pain following spinal cord stimulator implant  MRI thoracic and lumbar spine with anesthesia to rule out epidural hematoma vs nerve irritation from spinal cord stimulator. 2.  Continue current meds 3.  Patient made NPO for above 4.  Will follow closely.

## 2021-02-08 NOTE — Anesthesia Preprocedure Evaluation (Addendum)
Anesthesia Evaluation  Patient identified by MRN, date of birth, ID band Patient awake    Reviewed: Allergy & Precautions, NPO status , Patient's Chart, lab work & pertinent test results  Airway Mallampati: III  TM Distance: >3 FB Neck ROM: Full    Dental  (+) Teeth Intact   Pulmonary neg pulmonary ROS,    Pulmonary exam normal        Cardiovascular hypertension, Pt. on medications  Rhythm:Regular Rate:Normal     Neuro/Psych negative neurological ROS  negative psych ROS   GI/Hepatic Neg liver ROS, Hemorrhoids    Endo/Other  Morbid obesity  Renal/GU negative Renal ROS  negative genitourinary   Musculoskeletal Left foot pain s/p SCS implant   Abdominal (+) + obese,  Abdomen: soft.    Peds  Hematology negative hematology ROS (+)   Anesthesia Other Findings   Reproductive/Obstetrics                            Anesthesia Physical Anesthesia Plan  ASA: 3  Anesthesia Plan: MAC   Post-op Pain Management:    Induction: Intravenous  PONV Risk Score and Plan: 1 and Ondansetron, Midazolam, Treatment may vary due to age or medical condition and Propofol infusion  Airway Management Planned: Simple Face Mask and Nasal Cannula  Additional Equipment: None  Intra-op Plan:   Post-operative Plan:   Informed Consent: I have reviewed the patients History and Physical, chart, labs and discussed the procedure including the risks, benefits and alternatives for the proposed anesthesia with the patient or authorized representative who has indicated his/her understanding and acceptance.     Dental advisory given  Plan Discussed with: CRNA  Anesthesia Plan Comments: (Lab Results      Component                Value               Date                      WBC                      6.6                 12/01/2020                HGB                      15.5                12/01/2020                 HCT                      45.5                12/01/2020                MCV                      90.4                12/01/2020                PLT                      218.0  12/01/2020          )      Anesthesia Quick Evaluation

## 2021-02-08 NOTE — Transfer of Care (Signed)
Immediate Anesthesia Transfer of Care Note  Patient: Bruce Terry  Procedure(s) Performed: MRI WITH ANESTHESIA WITH/WITHOUT CONTRAST  Patient Location: PACU  Anesthesia Type:MAC  Level of Consciousness: awake, alert  and oriented  Airway & Oxygen Therapy: Patient Spontanous Breathing  Post-op Assessment: Report given to RN and Post -op Vital signs reviewed and stable  Post vital signs: Reviewed and stable  Last Vitals:  Vitals Value Taken Time  BP 139/99 02/08/21 1212  Temp    Pulse 86 02/08/21 1221  Resp 11 02/08/21 1221  SpO2 94 % 02/08/21 1221  Vitals shown include unvalidated device data.  Last Pain:  Vitals:   02/08/21 0939  TempSrc: Oral  PainSc: 4       Patients Stated Pain Goal: 4 (37/54/36 0677)  Complications: No notable events documented.

## 2021-02-09 ENCOUNTER — Encounter (HOSPITAL_COMMUNITY): Payer: Self-pay | Admitting: Radiology

## 2021-02-09 DIAGNOSIS — Z79899 Other long term (current) drug therapy: Secondary | ICD-10-CM | POA: Diagnosis not present

## 2021-02-09 DIAGNOSIS — I10 Essential (primary) hypertension: Secondary | ICD-10-CM | POA: Diagnosis not present

## 2021-02-09 DIAGNOSIS — M79672 Pain in left foot: Secondary | ICD-10-CM | POA: Diagnosis not present

## 2021-02-09 DIAGNOSIS — Z20822 Contact with and (suspected) exposure to covid-19: Secondary | ICD-10-CM | POA: Diagnosis not present

## 2021-02-09 MED ORDER — OXYCODONE HCL ER 15 MG PO T12A
15.0000 mg | EXTENDED_RELEASE_TABLET | Freq: Two times a day (BID) | ORAL | Status: DC
  Administered 2021-02-09: 15 mg via ORAL
  Filled 2021-02-09: qty 1

## 2021-02-09 MED ORDER — GABAPENTIN 300 MG PO CAPS: 300.0000 mg | ORAL_CAPSULE | Freq: Three times a day (TID) | ORAL | 1 refills | Status: DC

## 2021-02-09 MED ORDER — AMITRIPTYLINE HCL 50 MG PO TABS: 50.0000 mg | ORAL_TABLET | Freq: Every day | ORAL | 0 refills | Status: DC

## 2021-02-09 MED ORDER — OXYCODONE HCL ER 15 MG PO T12A: 15.0000 mg | EXTENDED_RELEASE_TABLET | Freq: Two times a day (BID) | ORAL | 0 refills | Status: DC

## 2021-02-09 MED ORDER — HYDROMORPHONE HCL 1 MG/ML IJ SOLN: 1.0000 mg | INTRAMUSCULAR | Status: DC | PRN

## 2021-02-09 MED ORDER — HYDROMORPHONE HCL 2 MG PO TABS: 6.0000 mg | ORAL_TABLET | ORAL | 0 refills | Status: AC | PRN

## 2021-02-09 MED ORDER — HYDROMORPHONE HCL 2 MG PO TABS
6.0000 mg | ORAL_TABLET | ORAL | Status: DC | PRN
  Administered 2021-02-09: 6 mg via ORAL
  Filled 2021-02-09: qty 3

## 2021-02-09 NOTE — Discharge Instructions (Signed)
-   Ambulate as tolerated -Keep dressings dry -resume normal diet -Keep follow up appointment for  02/14/21

## 2021-02-09 NOTE — Discharge Summary (Addendum)
Patient admitted 02/07/21 after increased left foot pain following spinal cord stimulator implant.  Patient treated with steroids, gabapentin, hydromorphone, and elavil.  MRI of thoracic/lumbar spine performed with out any identifying pathology accounting for pain.  Pain improved to point where patient felt able to be discharged home with wife on oral pain medications.  Patient will follow up in clinic with me on 02/14/21.

## 2021-02-09 NOTE — Progress Notes (Signed)
PT AVS reviewed and pt verbalized understanding of all teaching pt is going home with wife as transportation and all pt belongings are in pt possession.

## 2021-02-09 NOTE — Progress Notes (Signed)
Patient seen.  Doing much better, reducing PCA usage.  Moving foot better, ambulating some as well.  He has used 4mg  IV hydromorphone over past 6 hrs.   PE Alert, appropriate Able to dorsiflex and plantar flex left foot with mild pain  A/P 50 yo male with improving left foot pain, improved, following spinal cord stimulator implant.   Will transition off PCA by: -Adding oral hydromorphone 6 mg q3hrs as needed -Adding oxycontin 15mg   PO q12 hrs -Adding Hydromorphone 1mg  IV for pain not relieved by oral version.  2. Will continue other medications.  3.  Will re-evaluate this afternoon for possible discharge.

## 2021-02-22 DIAGNOSIS — M961 Postlaminectomy syndrome, not elsewhere classified: Secondary | ICD-10-CM | POA: Diagnosis not present

## 2021-02-22 DIAGNOSIS — G894 Chronic pain syndrome: Secondary | ICD-10-CM | POA: Diagnosis not present

## 2021-02-22 DIAGNOSIS — Z9689 Presence of other specified functional implants: Secondary | ICD-10-CM | POA: Diagnosis not present

## 2021-02-22 DIAGNOSIS — I10 Essential (primary) hypertension: Secondary | ICD-10-CM | POA: Diagnosis not present

## 2021-02-23 ENCOUNTER — Telehealth: Payer: Self-pay | Admitting: Adult Health

## 2021-02-23 DIAGNOSIS — Z1211 Encounter for screening for malignant neoplasm of colon: Secondary | ICD-10-CM

## 2021-02-23 NOTE — Telephone Encounter (Signed)
Patient states he was supposed to get a Colonoscopy done, however he hasn't heard anything back about this.  He also stated that he needs this done before the end of the year because of his insurance.

## 2021-02-23 NOTE — Telephone Encounter (Signed)
Pt notified that referral for colonoscopy has been placed & that he should be hearing from GI to get it scheduled. Pt verb understanding.

## 2021-03-02 ENCOUNTER — Other Ambulatory Visit: Payer: Self-pay

## 2021-03-02 ENCOUNTER — Encounter: Payer: Self-pay | Admitting: Gastroenterology

## 2021-03-02 ENCOUNTER — Ambulatory Visit (INDEPENDENT_AMBULATORY_CARE_PROVIDER_SITE_OTHER): Payer: BC Managed Care – PPO | Admitting: *Deleted

## 2021-03-02 DIAGNOSIS — Z23 Encounter for immunization: Secondary | ICD-10-CM | POA: Diagnosis not present

## 2021-03-02 NOTE — Progress Notes (Deleted)
Per orders of [name], NP , injection of 2nd Shingrix vaccine given by Franco Collet. Patient tolerated injection well.

## 2021-03-06 ENCOUNTER — Other Ambulatory Visit: Payer: Self-pay

## 2021-03-06 ENCOUNTER — Encounter: Payer: Self-pay | Admitting: Gastroenterology

## 2021-03-06 ENCOUNTER — Ambulatory Visit (AMBULATORY_SURGERY_CENTER): Payer: BC Managed Care – PPO | Admitting: *Deleted

## 2021-03-06 VITALS — Ht 70.25 in | Wt 270.0 lb

## 2021-03-06 DIAGNOSIS — Z1211 Encounter for screening for malignant neoplasm of colon: Secondary | ICD-10-CM

## 2021-03-06 MED ORDER — PEG-KCL-NACL-NASULF-NA ASC-C 100 G PO SOLR: 1.0000 | Freq: Once | ORAL | 0 refills | Status: AC

## 2021-03-06 NOTE — Progress Notes (Signed)
Pt's previsit is done over the phone and all paperwork (prep instructions, blank consent form to just read over) sent to patient.  Pt's name and DOB verified at the beginning of the previsit.  Pt denies any difficulty with ambulating.    No trouble with anesthesia, denies being told they were difficult to intubate, or hx/fam hx of malignant hyperthermia per pt   No egg or soy allergy  No home oxygen use   No medications for weight loss taken  Pt denies constipation issues  Pt informed that we do not do prior authorizations for prep  Generic Moviprep sent to pharmacy

## 2021-03-07 ENCOUNTER — Encounter: Payer: Self-pay | Admitting: Gastroenterology

## 2021-03-08 DIAGNOSIS — G894 Chronic pain syndrome: Secondary | ICD-10-CM | POA: Diagnosis not present

## 2021-03-08 DIAGNOSIS — Z6841 Body Mass Index (BMI) 40.0 and over, adult: Secondary | ICD-10-CM | POA: Diagnosis not present

## 2021-03-08 DIAGNOSIS — M961 Postlaminectomy syndrome, not elsewhere classified: Secondary | ICD-10-CM | POA: Diagnosis not present

## 2021-03-08 DIAGNOSIS — Z9689 Presence of other specified functional implants: Secondary | ICD-10-CM | POA: Diagnosis not present

## 2021-04-03 ENCOUNTER — Ambulatory Visit (AMBULATORY_SURGERY_CENTER): Payer: BC Managed Care – PPO | Admitting: Gastroenterology

## 2021-04-03 ENCOUNTER — Encounter: Payer: Self-pay | Admitting: Gastroenterology

## 2021-04-03 ENCOUNTER — Other Ambulatory Visit: Payer: Self-pay | Admitting: Gastroenterology

## 2021-04-03 VITALS — BP 154/94 | HR 89 | Temp 97.7°F | Resp 17 | Ht 70.25 in | Wt 270.0 lb

## 2021-04-03 DIAGNOSIS — D124 Benign neoplasm of descending colon: Secondary | ICD-10-CM

## 2021-04-03 DIAGNOSIS — D120 Benign neoplasm of cecum: Secondary | ICD-10-CM

## 2021-04-03 DIAGNOSIS — Z1211 Encounter for screening for malignant neoplasm of colon: Secondary | ICD-10-CM

## 2021-04-03 DIAGNOSIS — D123 Benign neoplasm of transverse colon: Secondary | ICD-10-CM | POA: Diagnosis not present

## 2021-04-03 MED ORDER — SODIUM CHLORIDE 0.9 % IV SOLN
500.0000 mL | Freq: Once | INTRAVENOUS | Status: DC
Start: 2021-04-03 — End: 2021-04-03

## 2021-04-03 NOTE — Progress Notes (Signed)
Pt's states no medical or surgical changes since previsit or office visit. 

## 2021-04-03 NOTE — Progress Notes (Signed)
History & Physical  Primary Care Physician:  [name], NP Primary Gastroenterologist: Lucio Edward, MD  CHIEF COMPLAINT:  CRC screening  HPI: Bruce Terry is a 50 y.o. male for CRC screening, average risk, for colonoscopy.  S/P spinal cord stimulator implant which can be turned off if cautery is needed.    Past Medical History:  Diagnosis Date   Hemorrhoid    Hyperlipidemia    Hypertension    Low back pain     Past Surgical History:  Procedure Laterality Date   BACK SURGERY     x 5   COLONOSCOPY     ELBOW SURGERY Right    EYE SURGERY Bilateral 2022   cataracts   RADIOLOGY WITH ANESTHESIA N/A 02/08/2021   Procedure: MRI WITH ANESTHESIA WITH/WITHOUT CONTRAST;  Surgeon: Radiologist, Medication, MD;  Location: [facility];  Service: Radiology;  Laterality: N/A;   right hand surgery Right    SHOULDER SURGERY Right    SPINAL CORD STIMULATOR IMPLANT  02/07/2021   at the [facility]    Prior to Admission medications   Medication Sig Start Date End Date Taking? Authorizing Provider  atorvastatin (LIPITOR) 80 MG tablet TAKE 1 TABLET BY MOUTH EVERY DAY Patient taking differently: Take 80 mg by mouth daily. 01/05/21  Yes Nafziger, Tommi Rumps, NP  lisinopril (ZESTRIL) 20 MG tablet TAKE 1 TABLET BY MOUTH EVERYDAY AT BEDTIME Patient taking differently: Take 20 mg by mouth daily. 01/01/21  Yes Nafziger, Tommi Rumps, NP  amitriptyline (ELAVIL) 50 MG tablet Take 1 tablet (50 mg total) by mouth at bedtime. 02/09/21   Reece Agar, MD  cyclobenzaprine (FLEXERIL) 10 MG tablet Take 10 mg by mouth at bedtime as needed for muscle spasms. Patient not taking: Reported on 03/06/2021 11/26/20   [provider]  gabapentin (NEURONTIN) 300 MG capsule Take 1 capsule (300 mg total) by mouth 3 (three) times daily. Patient not taking: Reported on 03/06/2021 02/09/21   Reece Agar, MD  oxyCODONE (OXYCONTIN) 15 mg 12 hr tablet Take 1 tablet (15 mg total) by mouth every 12  (twelve) hours. 02/09/21   Reece Agar, MD    Current Outpatient Medications  Medication Sig Dispense Refill   atorvastatin (LIPITOR) 80 MG tablet TAKE 1 TABLET BY MOUTH EVERY DAY (Patient taking differently: Take 80 mg by mouth daily.) 90 tablet 0   lisinopril (ZESTRIL) 20 MG tablet TAKE 1 TABLET BY MOUTH EVERYDAY AT BEDTIME (Patient taking differently: Take 20 mg by mouth daily.) 90 tablet 0   amitriptyline (ELAVIL) 50 MG tablet Take 1 tablet (50 mg total) by mouth at bedtime. 30 tablet 0   cyclobenzaprine (FLEXERIL) 10 MG tablet Take 10 mg by mouth at bedtime as needed for muscle spasms. (Patient not taking: Reported on 03/06/2021)     gabapentin (NEURONTIN) 300 MG capsule Take 1 capsule (300 mg total) by mouth 3 (three) times daily. (Patient not taking: Reported on 03/06/2021) 90 capsule 1   oxyCODONE (OXYCONTIN) 15 mg 12 hr tablet Take 1 tablet (15 mg total) by mouth every 12 (twelve) hours. 14 tablet 0   Current Facility-Administered Medications  Medication Dose Route Frequency Provider Last Rate Last Admin   0.9 %  sodium chloride infusion  500 mL Intravenous Once Ladene Artist, MD        Allergies as of 04/03/2021 - Review Complete 04/03/2021  Allergen Reaction Noted   Morphine and related  09/08/2014    Family History  Problem Relation Age of Onset  Hypertension Mother    Cirrhosis Mother    Diabetes Mother    Hypothyroidism Mother    Obesity Mother    Kidney disease Mother    Alcohol abuse Father    Colon cancer Neg Hx    Esophageal cancer Neg Hx    Rectal cancer Neg Hx    Stomach cancer Neg Hx     Social History   Socioeconomic History   Marital status: Married    Spouse name: Not on file   Number of children: Not on file   Years of education: Not on file   Highest education level: Not on file  Occupational History   Not on file  Tobacco Use   Smoking status: Never   Smokeless tobacco: Never  Vaping Use   Vaping Use: Never used  Substance and  Sexual Activity   Alcohol use: Not Currently    Comment: occasional   Drug use: No   Sexual activity: Not on file  Other Topics Concern   Not on file  Social History Narrative   Not on file   Social Determinants of Health   Financial Resource Strain: Not on file  Food Insecurity: Not on file  Transportation Needs: Not on file  Physical Activity: Not on file  Stress: Not on file  Social Connections: Not on file  Intimate Partner Violence: Not on file    Review of Systems:  All systems reviewed an negative except where noted in HPI.  Gen: Denies any fever, chills, sweats, anorexia, fatigue, weakness, malaise, weight loss, and sleep disorder CV: Denies chest pain, angina, palpitations, syncope, orthopnea, PND, peripheral edema, and claudication. Resp: Denies dyspnea at rest, dyspnea with exercise, cough, sputum, wheezing, coughing up blood, and pleurisy. GI: Denies vomiting blood, jaundice, and fecal incontinence.   Denies dysphagia or odynophagia. GU : Denies urinary burning, blood in urine, urinary frequency, urinary hesitancy, nocturnal urination, and urinary incontinence. MS: Denies joint pain, limitation of movement, and swelling, stiffness, low back pain, extremity pain. Denies muscle weakness, cramps, atrophy.  Derm: Denies rash, itching, dry skin, hives, moles, warts, or unhealing ulcers.  Psych: Denies depression, anxiety, memory loss, suicidal ideation, hallucinations, paranoia, and confusion. Heme: Denies bruising, bleeding, and enlarged lymph nodes. Neuro:  Denies any headaches, dizziness, paresthesias. Endo:  Denies any problems with DM, thyroid, adrenal function.   Physical Exam: General:  Alert, well-developed, in NAD Head:  Normocephalic and atraumatic. Eyes:  Sclera clear, no icterus.   Conjunctiva pink. Ears:  Normal auditory acuity. Mouth:  No deformity or lesions.  Neck:  Supple; no masses . Lungs:  Clear throughout to auscultation.   No wheezes,  crackles, or rhonchi. No acute distress. Heart:  Regular rate and rhythm; no murmurs. Abdomen:  Soft, nondistended, nontender. No masses, hepatomegaly. No obvious masses.  Normal bowel .    Rectal:  Deferred   Msk:  Symmetrical without gross deformities.. Pulses:  Normal pulses noted. Extremities:  Without edema. Neurologic:  Alert and  oriented x4;  grossly normal neurologically. Skin:  Intact without significant lesions or rashes. Cervical Nodes:  No significant cervical adenopathy. Psych:  Alert and cooperative. Normal mood and affect.   Impression / Plan:   CRC screening, average risk, for colonoscopy.  S/P spinal cord stimulator implant which can be turned off if cautery is needed.    Pricilla Riffle. Fuller Plan  04/03/2021, 3:24 PM See Shea Evans, [facility], to contact our on call provider

## 2021-04-03 NOTE — Op Note (Signed)
[facility] Patient Name: Bruce Terry Procedure Date: 04/03/2021 3:25 PM MRN: [id number] Endoscopist: Ladene Artist , MD Age: 50 Referring MD:  Date of Birth: [birth year] Gender: Male Account #: [id number] Procedure:                Colonoscopy Indications:              Screening for colorectal malignant neoplasm Medicines:                Monitored Anesthesia Care Procedure:                Pre-Anesthesia Assessment:                           - Prior to the procedure, a History and Physical                            was performed, and patient medications and                            allergies were reviewed. The patient's tolerance of                            previous anesthesia was also reviewed. The risks                            and benefits of the procedure and the sedation                            options and risks were discussed with the patient.                            All questions were answered, and informed consent                            was obtained. Prior Anticoagulants: The patient has                            taken no previous anticoagulant or antiplatelet                            agents. ASA Grade Assessment: III - A patient with                            severe systemic disease. After reviewing the risks                            and benefits, the patient was deemed in                            satisfactory condition to undergo the procedure.                           After obtaining informed consent, the colonoscope  was passed under direct vision. Throughout the                            procedure, the patient's blood pressure, pulse, and                            oxygen saturations were monitored continuously. The                            CF [id number] #[id number] was introduced through the anus                            and advanced to the the cecum, identified by                            appendiceal  orifice and ileocecal valve. The                            ileocecal valve, appendiceal orifice, and rectum                            were photographed. The quality of the bowel                            preparation was adequate. The colonoscopy was                            performed without difficulty. The patient tolerated                            the procedure well. Scope In: 3:30:21 PM Scope Out: 7:86:76 PM Scope Withdrawal Time: 0 hours 13 minutes 9 seconds  Total Procedure Duration: 0 hours 15 minutes 47 seconds  Findings:                 The perianal and digital rectal examinations were                            normal.                           Three sessile polyps were found in the descending                            colon, transverse colon and cecum. The polyps were                            6 to 8 mm in size. These polyps were removed with a                            cold snare. Resection and retrieval were complete.                           Multiple medium-mouthed diverticula were found in  the left colon. There was no evidence of                            diverticular bleeding.                           Internal hemorrhoids were found during                            retroflexion. The hemorrhoids were moderate and                            Grade I (internal hemorrhoids that do not prolapse).                           The exam was otherwise without abnormality on                            direct and retroflexion views. Complications:            No immediate complications. Estimated blood loss:                            None. Estimated Blood Loss:     Estimated blood loss: none. Impression:               - Three 6 to 8 mm polyps in the descending colon,                            in the transverse colon and in the cecum, removed                            with a cold snare. Resected and retrieved.                           - Mild  diverticulosis in the left colon.                           - Internal hemorrhoids.                           - The examination was otherwise normal on direct                            and retroflexion views. Recommendation:           - Repeat colonoscopy after studies are complete for                            surveillance based on pathology results.                           - Patient has a contact number available for                            emergencies. The signs and symptoms of potential  delayed complications were discussed with the                            patient. Return to normal activities tomorrow.                            Written discharge instructions were provided to the                            patient.                           - Hihg fiber diet.                           - Continue present medications.                           - Await pathology results. Ladene Artist, MD 04/03/2021 3:49:38 PM This report has been signed electronically.

## 2021-04-03 NOTE — Progress Notes (Signed)
Report given to PACU, vss 

## 2021-04-03 NOTE — Patient Instructions (Signed)
Impression/Recommendations:  Polyp,, diverticulosis, high fiber diet, and hemorrhoid handouts given to patient.  Continue present medications. Await pathology results.  YOU HAD AN ENDOSCOPIC PROCEDURE TODAY AT [facility]:   Refer to the procedure report that was given to you for any specific questions about what was found during the examination.  If the procedure report does not answer your questions, please call your gastroenterologist to clarify.  If you requested that your care partner not be given the details of your procedure findings, then the procedure report has been included in a sealed envelope for you to review at your convenience later.  YOU SHOULD EXPECT: Some feelings of bloating in the abdomen. Passage of more gas than usual.  Walking can help get rid of the air that was put into your GI tract during the procedure and reduce the bloating. If you had a lower endoscopy (such as a colonoscopy or flexible sigmoidoscopy) you may notice spotting of blood in your stool or on the toilet paper. If you underwent a bowel prep for your procedure, you may not have a normal bowel movement for a few days.  Please Note:  You might notice some irritation and congestion in your nose or some drainage.  This is from the oxygen used during your procedure.  There is no need for concern and it should clear up in a day or so.  SYMPTOMS TO REPORT IMMEDIATELY:  Following lower endoscopy (colonoscopy or flexible sigmoidoscopy):  Excessive amounts of blood in the stool  Significant tenderness or worsening of abdominal pains  Swelling of the abdomen that is new, acute  Fever of 100F or higher For urgent or emergent issues, a gastroenterologist can be reached at any hour by calling [phone]. Do not use MyChart messaging for urgent concerns.    DIET:  We do recommend a small meal at first, but then you may proceed to your regular diet.  Drink plenty of fluids but you should avoid  alcoholic beverages for 24 hours.  ACTIVITY:  You should plan to take it easy for the rest of today and you should NOT DRIVE or use heavy machinery until tomorrow (because of the sedation medicines used during the test).    FOLLOW UP: Our staff will call the number listed on your records 48-72 hours following your procedure to check on you and address any questions or concerns that you may have regarding the information given to you following your procedure. If we do not reach you, we will leave a message.  We will attempt to reach you two times.  During this call, we will ask if you have developed any symptoms of COVID 19. If you develop any symptoms (ie: fever, flu-like symptoms, shortness of breath, cough etc.) before then, please call [phone].  If you test positive for Covid 19 in the 2 weeks post procedure, please call and report this information to [place].    If any biopsies were taken you will be contacted by phone or by letter within the next 1-3 weeks.  Please call us at [phone] if you have not heard about the biopsies in 3 weeks.    SIGNATURES/CONFIDENTIALITY: You and/or your care partner have signed paperwork which will be entered into your electronic medical record.  These signatures attest to the fact that that the information above on your After Visit Summary has been reviewed and is understood.  Full responsibility of the confidentiality of this discharge information lies with you and/or your care-partner.

## 2021-04-03 NOTE — Progress Notes (Signed)
Called to room to assist during endoscopic procedure.  Patient ID and intended procedure confirmed with present staff. Received instructions for my participation in the procedure from the performing physician.  

## 2021-04-09 ENCOUNTER — Telehealth: Payer: Self-pay

## 2021-04-09 NOTE — Telephone Encounter (Signed)
  Follow up Call-  Call back number 04/03/2021  Post procedure Call Back phone  # [phone]  Permission to leave phone message Yes  Some recent data might be hidden     Patient questions:  Do you have a fever, pain , or abdominal swelling? No. Pain Score  0 *  Have you tolerated food without any problems? Yes  Have you been able to return to your normal activities? Yes.    Do you have any questions about your discharge instructions: Diet   No. Medications  No. Follow up visit  No.  Do you have questions or concerns about your Care? No.  Actions: * If pain score is 4 or above: No action needed, pain <4.

## 2021-04-16 ENCOUNTER — Other Ambulatory Visit: Payer: Self-pay | Admitting: Adult Health

## 2021-04-16 ENCOUNTER — Encounter: Payer: Self-pay | Admitting: Gastroenterology

## 2021-04-16 DIAGNOSIS — I10 Essential (primary) hypertension: Secondary | ICD-10-CM

## 2021-05-03 NOTE — Discharge Summary (Signed)
Physician Discharge Summary     Providing Compassionate, Quality Care - Together   Patient ID: Bruce Terry MRN: [id number] DOB/AGE: 51-Oct-1972 51 y.o.  Admit date: 02/07/2021 Discharge date: 02/09/2021  Admission Diagnoses: Left Foot Pain  Discharge Diagnoses:  Left foot pain  Discharged Condition: good  Hospital Course: Patient admitted 02/07/21 after increased left foot pain following spinal cord stimulator implant.  Patient treated with steroids, gabapentin, hydromorphone, and elavil.  MRI of thoracic/lumbar spine performed with out any identifying pathology accounting for pain.  Pain improved to point where patient felt able to be discharged home with wife on oral pain medications.  Consults: None  Significant Diagnostic Studies: radiology: MRI: CLINICAL DATA:  Left foot pain status post spinal cord stimulator  EXAM: MRI LUMBAR SPINE WITHOUT AND WITH CONTRAST   TECHNIQUE: Multiplanar and multiecho pulse sequences of the lumbar spine were obtained without and with intravenous contrast.   CONTRAST:  47mL GADAVIST GADOBUTROL 1 MMOL/ML IV SOLN   COMPARISON:  MRI of the lumbar spine October 19, 2012; CT of the lumbar spine March 19, 2018   FINDINGS: Segmentation:  Standard.   Alignment:  Levoconvex thoracolumbar scoliosis   Vertebrae: No fracture, evidence of discitis, or bone lesion. Hemangioma at T12. Postsurgical changes from PLIF from L3-4 through L5-S1.   Conus medullaris and cauda equina: Conus extends to the L1 level. Conus and cauda equina appear normal. Susceptibility artifact frontal spinal cord stimulator implant entering the spinal canal posteriorly at the T12-L1 level and extending superiorly in the posterior aspect of the spinal canal to the T8-9 level. No evidence of complication although susceptibility artifact from spinal cord stimulator degrades partially the images.   Paraspinal and other soft tissues: Postsurgical changes in the posterior  paraspinal soft tissues without unexpected findings.   Disc levels:   No high-grade spinal canal or neural foraminal stenosis in the visualized lower thoracic or lumbar spine.   IMPRESSION: Status post placement of spinal cord stimulator entering the posterior epidural space at the T12-L1 level and extending superiorly to the T8-9 level without obvious evidence of complication.     Electronically Signed   By: Pedro Earls M.D.   On: 02/08/2021 13:22 Treatments: steroids: and hydromorphone,elavil, gabapentin, flexeril  Discharge Exam: Blood pressure (!) 153/80, pulse 78, temperature 97.6 F (36.4 C), temperature source Oral, resp. rate 17, height 5\' 11"  (1.803 m), weight 122.5 kg, SpO2 96 %. Allodynia to palpation of the top of the left foot, able to plantar and dorsiflex  Disposition: Discharge disposition: 01-Home or Self Care     Patient to follow up with me in clinic on 02/14/21.  Discharge Instructions     Incentive spirometry RT   Complete by: As directed       Allergies as of 02/09/2021       Reactions   Morphine And Related    GI upset        Medication List     STOP taking these medications    Oxycodone HCl 10 MG Tabs Replaced by: oxyCODONE 15 mg 12 hr tablet       TAKE these medications    amitriptyline 50 MG tablet Commonly known as: ELAVIL Take 1 tablet (50 mg total) by mouth at bedtime.   atorvastatin 80 MG tablet Commonly known as: LIPITOR TAKE 1 TABLET BY MOUTH EVERY DAY   cyclobenzaprine 10 MG tablet Commonly known as: FLEXERIL Take 10 mg by mouth at bedtime as needed for muscle spasms.  gabapentin 300 MG capsule Commonly known as: NEURONTIN Take 1 capsule (300 mg total) by mouth 3 (three) times daily.   oxyCODONE 15 mg 12 hr tablet Commonly known as: OXYCONTIN Take 1 tablet (15 mg total) by mouth every 12 (twelve) hours. Replaces: Oxycodone HCl 10 MG Tabs       ASK your doctor about these medications     HYDROmorphone 2 MG tablet Commonly known as: DILAUDID Take 3 tablets (6 mg total) by mouth every 3 (three) hours as needed for up to 5 days for severe pain (may take 2-3 tablets). Ask about: Should I take this medication?         Signed: Arturo Morton. Davy Pique, [facility] [place] [address], [place], [place] [address] P: [phone]     F: [phone]  05/03/2021, 4:07 PM

## 2021-05-08 DIAGNOSIS — M961 Postlaminectomy syndrome, not elsewhere classified: Secondary | ICD-10-CM | POA: Diagnosis not present

## 2021-05-08 DIAGNOSIS — G894 Chronic pain syndrome: Secondary | ICD-10-CM | POA: Diagnosis not present

## 2021-05-08 DIAGNOSIS — Z9689 Presence of other specified functional implants: Secondary | ICD-10-CM | POA: Diagnosis not present

## 2021-07-13 ENCOUNTER — Other Ambulatory Visit: Payer: Self-pay | Admitting: Adult Health

## 2021-07-13 DIAGNOSIS — I10 Essential (primary) hypertension: Secondary | ICD-10-CM

## 2021-07-13 DIAGNOSIS — E785 Hyperlipidemia, unspecified: Secondary | ICD-10-CM

## 2021-07-24 DIAGNOSIS — M5416 Radiculopathy, lumbar region: Secondary | ICD-10-CM | POA: Diagnosis not present

## 2021-07-24 DIAGNOSIS — G894 Chronic pain syndrome: Secondary | ICD-10-CM | POA: Diagnosis not present

## 2021-07-24 DIAGNOSIS — M961 Postlaminectomy syndrome, not elsewhere classified: Secondary | ICD-10-CM | POA: Diagnosis not present

## 2021-07-24 DIAGNOSIS — G90522 Complex regional pain syndrome I of left lower limb: Secondary | ICD-10-CM | POA: Diagnosis not present

## 2021-08-08 ENCOUNTER — Ambulatory Visit (INDEPENDENT_AMBULATORY_CARE_PROVIDER_SITE_OTHER): Payer: BC Managed Care – PPO | Admitting: Adult Health

## 2021-08-08 ENCOUNTER — Encounter: Payer: Self-pay | Admitting: Adult Health

## 2021-08-08 VITALS — BP 140/80 | HR 93 | Temp 99.4°F | Ht 70.0 in | Wt 294.0 lb

## 2021-08-08 DIAGNOSIS — Z6841 Body Mass Index (BMI) 40.0 and over, adult: Secondary | ICD-10-CM

## 2021-08-08 DIAGNOSIS — R112 Nausea with vomiting, unspecified: Secondary | ICD-10-CM | POA: Diagnosis not present

## 2021-08-08 DIAGNOSIS — R6882 Decreased libido: Secondary | ICD-10-CM

## 2021-08-08 LAB — COMPREHENSIVE METABOLIC PANEL
ALT: 116 U/L — ABNORMAL HIGH (ref 0–53)
AST: 70 U/L — ABNORMAL HIGH (ref 0–37)
Albumin: 4.9 g/dL (ref 3.5–5.2)
Alkaline Phosphatase: 79 U/L (ref 39–117)
BUN: 19 mg/dL (ref 6–23)
CO2: 26 mEq/L (ref 19–32)
Calcium: 9.8 mg/dL (ref 8.4–10.5)
Chloride: 102 mEq/L (ref 96–112)
Creatinine, Ser: 1.08 mg/dL (ref 0.40–1.50)
GFR: 79.51 mL/min (ref >60.00)
Glucose, Bld: 124 mg/dL — ABNORMAL HIGH (ref 70–99)
Potassium: 3.7 mEq/L (ref 3.5–5.1)
Sodium: 137 mEq/L (ref 135–145)
Total Bilirubin: 0.7 mg/dL (ref 0.2–1.2)
Total Protein: 7.6 g/dL (ref 6.0–8.3)

## 2021-08-08 LAB — CBC WITH DIFFERENTIAL/PLATELET
Basophils Absolute: 0.1 10*3/uL (ref 0.0–0.1)
Basophils Relative: 0.8 % (ref 0.0–3.0)
Eosinophils Absolute: 0.5 10*3/uL (ref 0.0–0.7)
Eosinophils Relative: 6.1 % — ABNORMAL HIGH (ref 0.0–5.0)
HCT: 45.9 % (ref 39.0–52.0)
Hemoglobin: 15.6 g/dL (ref 13.0–17.0)
Lymphocytes Relative: 30.9 % (ref 12.0–46.0)
Lymphs Abs: 2.7 10*3/uL (ref 0.7–4.0)
MCHC: 34 g/dL (ref 30.0–36.0)
MCV: 89.7 fl (ref 78.0–100.0)
Monocytes Absolute: 0.7 10*3/uL (ref 0.1–1.0)
Monocytes Relative: 8.5 % (ref 3.0–12.0)
Neutro Abs: 4.7 10*3/uL (ref 1.4–7.7)
Neutrophils Relative %: 53.7 % (ref 43.0–77.0)
Platelets: 223 10*3/uL (ref 150.0–400.0)
RBC: 5.12 Mil/uL (ref 4.22–5.81)
RDW: 13.1 % (ref 11.5–15.5)
WBC: 8.8 10*3/uL (ref 4.0–10.5)

## 2021-08-08 MED ORDER — WEGOVY 0.25 MG/0.5ML ~~LOC~~ SOAJ: 0.2500 mg | SUBCUTANEOUS | 0 refills | Status: DC

## 2021-08-08 MED ORDER — OMEPRAZOLE 20 MG PO CPDR: 20.0000 mg | DELAYED_RELEASE_CAPSULE | Freq: Every day | ORAL | 3 refills | Status: DC

## 2021-08-08 NOTE — Progress Notes (Signed)
? ?Subjective:  ? ? Patient ID: Bruce Terry, male    DOB: April 18, 1971, 51 y.o.   MRN: [id number] ? ?HPI ?51 year old male who  has a past medical history of Hemorrhoid, Hyperlipidemia, Hypertension, and Low back pain. ? ?He presents to the office today with multiple issues.  He had a spinal cord stimulator placed in September 2022.  He reports that since that he has had multiple issues including vomiting upwards of 5 times a week, he will suddenly become nauseous and throw up.  There is no rhyme or reason.  Denies abdominal pain, changes in stools, GERD symptoms, blood in his vomit or stools.  After vomiting he feels better.  He also reports loss of libido, generalized "not feeling well" and weight gain ( despite diet and exercise) ? ?He was seen by the surgeon who did the spinal cord stimulator and states that "they said that none of these problems could be coming from the spinal cord stimulator". ? ?He has not had any fevers, chills, rashes, or any other abnormality. ? ?Wt Readings from Last 3 Encounters:  ?08/08/21 294 lb (133.4 kg)  ?04/03/21 270 lb (122.5 kg)  ?03/06/21 270 lb (122.5 kg)  ? ?Review of Systems ?See HPI  ? ?Past Medical History:  ?Diagnosis Date  ? Hemorrhoid   ? Hyperlipidemia   ? Hypertension   ? Low back pain   ? ? ?Social History  ? ?Socioeconomic History  ? Marital status: Married  ?  Spouse name: Not on file  ? Number of children: Not on file  ? Years of education: Not on file  ? Highest education level: Not on file  ?Occupational History  ? Not on file  ?Tobacco Use  ? Smoking status: Never  ? Smokeless tobacco: Never  ?Vaping Use  ? Vaping Use: Never used  ?Substance and Sexual Activity  ? Alcohol use: Not Currently  ?  Comment: occasional  ? Drug use: No  ? Sexual activity: Not on file  ?Other Topics Concern  ? Not on file  ?Social History Narrative  ? Not on file  ? ?Social Determinants of Health  ? ?Financial Resource Strain: Not on file  ?Food Insecurity: Not on file   ?Transportation Needs: Not on file  ?Physical Activity: Not on file  ?Stress: Not on file  ?Social Connections: Not on file  ?Intimate Partner Violence: Not on file  ? ? ?Past Surgical History:  ?Procedure Laterality Date  ? BACK SURGERY    ? x 5  ? COLONOSCOPY    ? ELBOW SURGERY Right   ? EYE SURGERY Bilateral 2022  ? cataracts  ? RADIOLOGY WITH ANESTHESIA N/A 02/08/2021  ? Procedure: MRI WITH ANESTHESIA WITH/WITHOUT CONTRAST;  Surgeon: Radiologist, Medication, MD;  Location: [facility];  Service: Radiology;  Laterality: N/A;  ? right hand surgery Right   ? SHOULDER SURGERY Right   ? SPINAL CORD STIMULATOR IMPLANT  02/07/2021  ? at the [facility]  ? ? ?Family History  ?Problem Relation Age of Onset  ? Hypertension Mother   ? Cirrhosis Mother   ? Diabetes Mother   ? Hypothyroidism Mother   ? Obesity Mother   ? Kidney disease Mother   ? Alcohol abuse Father   ? Colon cancer Neg Hx   ? Esophageal cancer Neg Hx   ? Rectal cancer Neg Hx   ? Stomach cancer Neg Hx   ? ? ?Allergies  ?Allergen Reactions  ? Morphine And Related   ?  GI upset  ? ? ?Current Outpatient Medications on File Prior to Visit  ?Medication Sig Dispense Refill  ? amitriptyline (ELAVIL) 50 MG tablet Take 1 tablet (50 mg total) by mouth at bedtime. 30 tablet 0  ? atorvastatin (LIPITOR) 80 MG tablet TAKE 1 TABLET BY MOUTH EVERY DAY 90 tablet 0  ? cyclobenzaprine (FLEXERIL) 10 MG tablet Take 10 mg by mouth at bedtime as needed for muscle spasms.    ? lisinopril (ZESTRIL) 20 MG tablet TAKE 1 TABLET BY MOUTH EVERYDAY AT BEDTIME 90 tablet 0  ? ?No current facility-administered medications on file prior to visit.  ? ? ?BP 140/80   Pulse 93   Temp 99.4 ?F (37.4 ?C) (Oral)   Ht '5\' 10"'$  (1.778 m)   Wt 294 lb (133.4 kg)   SpO2 93%   BMI 42.18 kg/m?  ? ? ?   ?Objective:  ? Physical Exam ?Vitals and nursing note reviewed.  ?Constitutional:   ?   Appearance: Normal appearance. He is obese.  ?Cardiovascular:  ?   Rate and Rhythm: Normal rate and  regular rhythm.  ?   Pulses: Normal pulses.  ?   Heart sounds: Normal heart sounds.  ?Pulmonary:  ?   Effort: Pulmonary effort is normal.  ?   Breath sounds: Normal breath sounds.  ?Abdominal:  ?   General: Abdomen is flat. Bowel sounds are normal. There is no distension.  ?   Palpations: Abdomen is soft.  ?   Tenderness: There is no abdominal tenderness.  ?Musculoskeletal:     ?   General: Normal range of motion.  ?Skin: ?   General: Skin is warm and dry.  ?Neurological:  ?   General: No focal deficit present.  ?   Mental Status: He is alert and oriented to person, place, and time.  ?Psychiatric:     ?   Mood and Affect: Mood normal.     ?   Behavior: Behavior normal.     ?   Thought Content: Thought content normal.  ? ?   ?Assessment & Plan:  ?1. Nausea and vomiting, unspecified vomiting type ?-I am not sure what effect his stimulator would have on his symptoms, there is likely no correlation.  We will have him trial using Prilosec for the next 30 days for the nausea and vomiting.  Can consider CT or referral to GI if no improvement ?- CBC with Differential/Platelet; Future ?- Comprehensive metabolic panel; Future ?- Comprehensive metabolic panel ?- CBC with Differential/Platelet ? ?2. Decreased libido ? ?- Testosterone,Free and Total; Future ?- Testosterone,Free and Total ? ?3. Morbid obesity with BMI of 40.0-44.9, adult ([place]) ?-Discussed various options of weight loss.  He is interested in [place] despite knowing the side effects which can include nausea and vomiting.  He would like to try this medication. ?- Will have him follow up in one month or sooner if needd ?- Semaglutide-Weight Management (WEGOVY) 0.25 MG/0.5ML SOAJ; Inject 0.25 mg into the skin once a week.  Dispense: 2 mL; Refill: 0 ?- CBC with Differential/Platelet; Future ?- Comprehensive metabolic panel; Future ?- Testosterone,Free and Total; Future ?- Testosterone,Free and Total ?- Comprehensive metabolic panel ?- CBC with  Differential/Platelet ? ?[name], NP ? ?

## 2021-08-10 ENCOUNTER — Other Ambulatory Visit: Payer: Self-pay

## 2021-08-10 ENCOUNTER — Telehealth: Payer: Self-pay | Admitting: Adult Health

## 2021-08-10 DIAGNOSIS — R7989 Other specified abnormal findings of blood chemistry: Secondary | ICD-10-CM

## 2021-08-10 NOTE — Telephone Encounter (Signed)
Patient notified of update  and verbalized understanding. 

## 2021-08-10 NOTE — Telephone Encounter (Signed)
Patient called in to return Kendra's call regarding his lab results. ? ?Please advise. ?

## 2021-08-12 LAB — TESTOSTERONE,FREE AND TOTAL
Testosterone, Free: 2.3 pg/mL — ABNORMAL LOW (ref 7.2–24.0)
Testosterone: 51 ng/dL — ABNORMAL LOW (ref 264–916)

## 2021-08-14 ENCOUNTER — Telehealth: Payer: Self-pay | Admitting: Adult Health

## 2021-08-14 DIAGNOSIS — R7989 Other specified abnormal findings of blood chemistry: Secondary | ICD-10-CM

## 2021-08-14 DIAGNOSIS — R112 Nausea with vomiting, unspecified: Secondary | ICD-10-CM

## 2021-08-14 NOTE — Telephone Encounter (Signed)
Patient spouse Bruce Terry called in requesting for the referral to be changed due to [facility] not having anything until the end of the month.  ? ?Bruce Terry is asking for the patient to be sent to Dr.Gorris at [facility]. ? ?Bruce Terry would like to be contacted when this is done. ? ?Please advise. ? ? ?

## 2021-08-15 NOTE — Telephone Encounter (Signed)
Okay for referral?

## 2021-08-15 NOTE — Telephone Encounter (Signed)
Spoke to pt and advised that Dr.Gorris maybe a endocrinologist. Asked pt spouse for first name of the provider but she was unaware. Pt spouse advised me to just make a referral for [facility] since Dr.Gorris was not in network. Referral placed. No further action needed! ?

## 2021-08-28 DIAGNOSIS — R5382 Chronic fatigue, unspecified: Secondary | ICD-10-CM | POA: Diagnosis not present

## 2021-08-28 DIAGNOSIS — R6882 Decreased libido: Secondary | ICD-10-CM | POA: Diagnosis not present

## 2021-08-28 DIAGNOSIS — E291 Testicular hypofunction: Secondary | ICD-10-CM | POA: Diagnosis not present

## 2021-08-29 ENCOUNTER — Ambulatory Visit (INDEPENDENT_AMBULATORY_CARE_PROVIDER_SITE_OTHER): Payer: BC Managed Care – PPO | Admitting: Adult Health

## 2021-08-29 ENCOUNTER — Encounter: Payer: Self-pay | Admitting: Adult Health

## 2021-08-29 VITALS — BP 140/80 | HR 85 | Temp 98.2°F | Ht 70.0 in | Wt 296.0 lb

## 2021-08-29 DIAGNOSIS — R112 Nausea with vomiting, unspecified: Secondary | ICD-10-CM

## 2021-08-29 DIAGNOSIS — Z6841 Body Mass Index (BMI) 40.0 and over, adult: Secondary | ICD-10-CM

## 2021-08-29 DIAGNOSIS — R109 Unspecified abdominal pain: Secondary | ICD-10-CM | POA: Diagnosis not present

## 2021-08-29 MED ORDER — SEMAGLUTIDE-WEIGHT MANAGEMENT 0.5 MG/0.5ML ~~LOC~~ SOAJ: 0.5000 mg | SUBCUTANEOUS | 0 refills | Status: DC

## 2021-08-29 NOTE — Progress Notes (Signed)
? ?Subjective:  ? ? Patient ID: Bruce Terry, male    DOB: 1971/02/18, 51 y.o.   MRN: [id number] ? ?HPI ?51 year old male who  has a past medical history of Hemorrhoid, Hyperlipidemia, Hypertension, and Low back pain. ?He presents to the office today for multiple issues. ? ?States she has had a follow-up regarding nausea and vomiting.  He was last seen on 08/08/2021 he was complaining that since September 2022 when he had a spinal stimulator placed he had been vomiting upwards to 5 times a week.  He reported that he would suddenly become nauseous and throw up.  There was no rhyme or reason to this.  He denies abdominal pain, changes in stools, GERD, or blood in vomit or stools.  Today he reports that he stopped his Cymbalta and since stopping this medication he has not had any issues with nausea or vomiting. ? ?Additionally, he has started his Wegovy trial pack and has found that he is eating less because he no longer feels as hungry.  He would like to continue with [facility]. ? ?Furthermore, about 2 weeks ago he was under a house working and crawling on his stomach, when he brought his right leg up to his chest he felt tightness and when he brought his leg back down he felt a tearing sensation and felt a pop.  He had a bruise on his right flank and reports that he could not get out of bed for roughly a week and 1/2 to 2 weeks because of the pain.  He feels much better today and feels as though the pain is improving.  No longer has a bruise. ? ? ?Review of Systems ?See HPI  ? ?Past Medical History:  ?Diagnosis Date  ? Hemorrhoid   ? Hyperlipidemia   ? Hypertension   ? Low back pain   ? ? ?Social History  ? ?Socioeconomic History  ? Marital status: Married  ?  Spouse name: Not on file  ? Number of children: Not on file  ? Years of education: Not on file  ? Highest education level: Not on file  ?Occupational History  ? Not on file  ?Tobacco Use  ? Smoking status: Never  ? Smokeless tobacco: Never  ?Vaping Use  ?  Vaping Use: Never used  ?Substance and Sexual Activity  ? Alcohol use: Not Currently  ?  Comment: occasional  ? Drug use: No  ? Sexual activity: Not on file  ?Other Topics Concern  ? Not on file  ?Social History Narrative  ? Not on file  ? ?Social Determinants of Health  ? ?Financial Resource Strain: Not on file  ?Food Insecurity: Not on file  ?Transportation Needs: Not on file  ?Physical Activity: Not on file  ?Stress: Not on file  ?Social Connections: Not on file  ?Intimate Partner Violence: Not on file  ? ? ?Past Surgical History:  ?Procedure Laterality Date  ? BACK SURGERY    ? x 5  ? COLONOSCOPY    ? ELBOW SURGERY Right   ? EYE SURGERY Bilateral 2022  ? cataracts  ? RADIOLOGY WITH ANESTHESIA N/A 02/08/2021  ? Procedure: MRI WITH ANESTHESIA WITH/WITHOUT CONTRAST;  Surgeon: Radiologist, Medication, MD;  Location: [facility];  Service: Radiology;  Laterality: N/A;  ? right hand surgery Right   ? SHOULDER SURGERY Right   ? SPINAL CORD STIMULATOR IMPLANT  02/07/2021  ? at the [facility]  ? ? ?Family History  ?Problem Relation Age of  Onset  ? Hypertension Mother   ? Cirrhosis Mother   ? Diabetes Mother   ? Hypothyroidism Mother   ? Obesity Mother   ? Kidney disease Mother   ? Alcohol abuse Father   ? Colon cancer Neg Hx   ? Esophageal cancer Neg Hx   ? Rectal cancer Neg Hx   ? Stomach cancer Neg Hx   ? ? ?Allergies  ?Allergen Reactions  ? Morphine And Related   ?  GI upset  ? ? ?Current Outpatient Medications on File Prior to Visit  ?Medication Sig Dispense Refill  ? amitriptyline (ELAVIL) 50 MG tablet Take 1 tablet (50 mg total) by mouth at bedtime. 30 tablet 0  ? atorvastatin (LIPITOR) 80 MG tablet TAKE 1 TABLET BY MOUTH EVERY DAY 90 tablet 0  ? cyclobenzaprine (FLEXERIL) 10 MG tablet Take 10 mg by mouth at bedtime as needed for muscle spasms.    ? lisinopril (ZESTRIL) 20 MG tablet TAKE 1 TABLET BY MOUTH EVERYDAY AT BEDTIME 90 tablet 0  ? omeprazole (PRILOSEC) 20 MG capsule Take 1 capsule (20 mg  total) by mouth daily. 30 capsule 3  ? Oxycodone HCl 10 MG TABS Take 10 mg by mouth every 6 (six) hours as needed.    ? testosterone cypionate (DEPOTESTOSTERONE CYPIONATE) 200 MG/ML injection SMARTSIG:0.5 Milliliter(s) IM Once a Week    ? ?No current facility-administered medications on file prior to visit.  ? ? ?BP 140/80   Pulse 85   Temp 98.2 ?F (36.8 ?C) (Oral)   Ht '5\' 10"'$  (1.778 m)   Wt 296 lb (134.3 kg)   SpO2 95%   BMI 42.47 kg/m?  ? ? ?   ?Objective:  ? Physical Exam ?Vitals and nursing note reviewed.  ?Constitutional:   ?   Appearance: Normal appearance. He is obese.  ?Cardiovascular:  ?   Rate and Rhythm: Normal rate and regular rhythm.  ?   Pulses: Normal pulses.  ?   Heart sounds: Normal heart sounds.  ?Pulmonary:  ?   Effort: Pulmonary effort is normal.  ?   Breath sounds: Normal breath sounds.  ?Abdominal:  ?   General: Abdomen is flat. Bowel sounds are normal. There is no distension.  ?   Palpations: Abdomen is soft. There is no mass.  ?   Tenderness: There is no abdominal tenderness.  ?   Hernia: No hernia is present.  ?Musculoskeletal:     ?   General: No swelling, tenderness or deformity. Normal range of motion.  ?Skin: ?   General: Skin is warm and dry.  ?Neurological:  ?   General: No focal deficit present.  ?   Mental Status: He is alert and oriented to person, place, and time.  ?Psychiatric:     ?   Mood and Affect: Mood normal.     ?   Behavior: Behavior normal.     ?   Thought Content: Thought content normal.  ? ?   ?Assessment & Plan:  ?1. Morbid obesity with BMI of 40.0-44.9, adult ([place]) ?-We will increase his Wegovy to 0.5 mg weekly x1 month and see how he does.  We will have him follow-up in 30 days and if doing well can increase his dose. ?- Semaglutide-Weight Management 0.5 MG/0.5ML SOAJ; Inject 0.5 mg into the skin once a week.  Dispense: 2 mL; Refill: 0 ? ?2. Nausea and vomiting, unspecified vomiting type ?-Seems to have resolved with stopping Cymbalta.  Can follow-up with pain  management for  further medication therapy ? ?3. Right flank pain ?-Likely muscle strain or minor tear.  Seems to have nearly resolved.  Follow-up as needed ? ?[name], NP ? ?Time of total for care on the day of the encounter 33 minutes. This includes time spent in both face-to-face and non-face-to-face activities including preparing for the visit, reviewing the chart, time spent with patient, evaluation and counseling patient/family/caregiver, coordinating care, and time spent documenting in the chart which was performed on the date of service (08/29/2021). Note: this excludes any time spent performing billable procedures or separate charges (such as time spent counseling smoking cessation); these charges are billed separately.  ? ? ? ? ?

## 2021-09-02 ENCOUNTER — Other Ambulatory Visit: Payer: Self-pay

## 2021-09-02 ENCOUNTER — Emergency Department (HOSPITAL_COMMUNITY)
Admission: EM | Admit: 2021-09-02 | Discharge: 2021-09-02 | Disposition: A | Discharge status: Home / Self Care | Payer: BC Managed Care – PPO | Attending: Emergency Medicine | Admitting: Emergency Medicine

## 2021-09-02 ENCOUNTER — Encounter (HOSPITAL_COMMUNITY): Payer: Self-pay

## 2021-09-02 ENCOUNTER — Emergency Department (HOSPITAL_COMMUNITY): Payer: BC Managed Care – PPO

## 2021-09-02 DIAGNOSIS — M47816 Spondylosis without myelopathy or radiculopathy, lumbar region: Secondary | ICD-10-CM | POA: Diagnosis not present

## 2021-09-02 DIAGNOSIS — Z041 Encounter for examination and observation following transport accident: Secondary | ICD-10-CM | POA: Diagnosis not present

## 2021-09-02 DIAGNOSIS — M549 Dorsalgia, unspecified: Secondary | ICD-10-CM | POA: Diagnosis not present

## 2021-09-02 DIAGNOSIS — Y92410 Unspecified street and highway as the place of occurrence of the external cause: Secondary | ICD-10-CM | POA: Insufficient documentation

## 2021-09-02 DIAGNOSIS — M545 Low back pain, unspecified: Secondary | ICD-10-CM | POA: Diagnosis not present

## 2021-09-02 DIAGNOSIS — Z79899 Other long term (current) drug therapy: Secondary | ICD-10-CM | POA: Diagnosis not present

## 2021-09-02 DIAGNOSIS — I10 Essential (primary) hypertension: Secondary | ICD-10-CM | POA: Diagnosis not present

## 2021-09-02 MED ORDER — HYDROMORPHONE HCL 1 MG/ML IJ SOLN
1.0000 mg | Freq: Once | INTRAMUSCULAR | Status: AC
  Administered 2021-09-02: 1 mg via INTRAVENOUS
  Filled 2021-09-02: qty 1

## 2021-09-02 MED ORDER — OXYCODONE-ACETAMINOPHEN 5-325 MG PO TABS
1.0000 | ORAL_TABLET | Freq: Once | ORAL | Status: AC
  Administered 2021-09-02: 1 via ORAL
  Filled 2021-09-02: qty 1
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
## 2021-09-04 ENCOUNTER — Telehealth: Payer: Self-pay | Admitting: Adult Health

## 2021-09-04 ENCOUNTER — Other Ambulatory Visit: Payer: Self-pay | Admitting: Adult Health

## 2021-09-04 NOTE — Telephone Encounter (Signed)
Please advise 

## 2021-09-04 NOTE — Telephone Encounter (Signed)
Pt calling regarding his prescription for Wegovy to 0.5 mg weekly x1 month stating the pharmacy has denied and he is out of samples. States he was told by provider to call when the pharmacy denies the prescription so that you can prescribe something that they won't deny.  ?

## 2021-09-06 DIAGNOSIS — M5416 Radiculopathy, lumbar region: Secondary | ICD-10-CM | POA: Diagnosis not present

## 2021-09-06 NOTE — Telephone Encounter (Signed)
Bruce Terry (Key: [id number]) ?Rx #: [id number] ?Wegovy 0.'5MG'$ /0.5ML auto-injectors ?  ?Form ?[employer] Electronic Prior Authorization Form [phone] NCPDP) ?Created ?8 days ago ?Sent to Plan ?7 days ago ?Plan Response ?7 days ago ?Submit Clinical Questions ?7 days ago ?Determination ?Favorable ?7 days ago ?Your prior authorization for Bruce Terry has been approved! ?MORE INFO ?Personalized support and financial assistance may be available through the [facility] [employer]Go[employer] program. For more information, and to see program requirements, click on the More Info button to the right. ? ?Message from plan: Request Reference Number: [id number]. WEGOVY INJ 0.'5MG'$  is approved through 04/01/2022. Your patient may now fill this prescription and it will be covered. ?

## 2021-09-06 NOTE — Telephone Encounter (Signed)
Patient notified of update  and verbalized understanding. 

## 2021-09-11 DIAGNOSIS — Z981 Arthrodesis status: Secondary | ICD-10-CM | POA: Diagnosis not present

## 2021-09-11 DIAGNOSIS — M19011 Primary osteoarthritis, right shoulder: Secondary | ICD-10-CM | POA: Diagnosis not present

## 2021-09-11 DIAGNOSIS — M25551 Pain in right hip: Secondary | ICD-10-CM | POA: Diagnosis not present

## 2021-09-11 DIAGNOSIS — M461 Sacroiliitis, not elsewhere classified: Secondary | ICD-10-CM | POA: Diagnosis not present

## 2021-09-11 DIAGNOSIS — M25511 Pain in right shoulder: Secondary | ICD-10-CM | POA: Diagnosis not present

## 2021-09-11 DIAGNOSIS — M160 Bilateral primary osteoarthritis of hip: Secondary | ICD-10-CM | POA: Diagnosis not present

## 2021-09-14 ENCOUNTER — Other Ambulatory Visit: Payer: Self-pay | Admitting: Orthopedic Surgery

## 2021-09-14 DIAGNOSIS — M25611 Stiffness of right shoulder, not elsewhere classified: Secondary | ICD-10-CM

## 2021-09-14 DIAGNOSIS — M25511 Pain in right shoulder: Secondary | ICD-10-CM

## 2021-09-20 DIAGNOSIS — M5416 Radiculopathy, lumbar region: Secondary | ICD-10-CM | POA: Diagnosis not present

## 2021-09-21 ENCOUNTER — Other Ambulatory Visit: Payer: Self-pay | Admitting: Neurological Surgery

## 2021-09-21 DIAGNOSIS — M5416 Radiculopathy, lumbar region: Secondary | ICD-10-CM

## 2021-09-24 DIAGNOSIS — G90522 Complex regional pain syndrome I of left lower limb: Secondary | ICD-10-CM | POA: Diagnosis not present

## 2021-09-24 DIAGNOSIS — Z9689 Presence of other specified functional implants: Secondary | ICD-10-CM | POA: Diagnosis not present

## 2021-09-24 DIAGNOSIS — M961 Postlaminectomy syndrome, not elsewhere classified: Secondary | ICD-10-CM | POA: Diagnosis not present

## 2021-09-24 DIAGNOSIS — F1120 Opioid dependence, uncomplicated: Secondary | ICD-10-CM | POA: Diagnosis not present

## 2021-10-01 ENCOUNTER — Ambulatory Visit
Admission: RE | Admit: 2021-10-01 | Discharge: 2021-10-01 | Disposition: A | Discharge status: Home / Self Care | Payer: BC Managed Care – PPO | Source: Ambulatory Visit | Attending: Neurological Surgery | Admitting: Neurological Surgery

## 2021-10-01 DIAGNOSIS — M545 Low back pain, unspecified: Secondary | ICD-10-CM | POA: Diagnosis not present

## 2021-10-01 DIAGNOSIS — M5416 Radiculopathy, lumbar region: Secondary | ICD-10-CM

## 2021-10-18 DIAGNOSIS — M961 Postlaminectomy syndrome, not elsewhere classified: Secondary | ICD-10-CM | POA: Diagnosis not present

## 2021-10-18 DIAGNOSIS — Z6841 Body Mass Index (BMI) 40.0 and over, adult: Secondary | ICD-10-CM | POA: Diagnosis not present

## 2021-10-20 ENCOUNTER — Other Ambulatory Visit: Payer: Self-pay | Admitting: Adult Health

## 2021-10-20 DIAGNOSIS — E785 Hyperlipidemia, unspecified: Secondary | ICD-10-CM

## 2021-10-20 DIAGNOSIS — I10 Essential (primary) hypertension: Secondary | ICD-10-CM

## 2021-10-25 DIAGNOSIS — E291 Testicular hypofunction: Secondary | ICD-10-CM | POA: Diagnosis not present

## 2021-10-25 DIAGNOSIS — Z125 Encounter for screening for malignant neoplasm of prostate: Secondary | ICD-10-CM | POA: Diagnosis not present

## 2021-11-01 DIAGNOSIS — R5382 Chronic fatigue, unspecified: Secondary | ICD-10-CM | POA: Diagnosis not present

## 2021-11-01 DIAGNOSIS — E291 Testicular hypofunction: Secondary | ICD-10-CM | POA: Diagnosis not present

## 2021-11-01 DIAGNOSIS — R6882 Decreased libido: Secondary | ICD-10-CM | POA: Diagnosis not present

## 2021-11-25 ENCOUNTER — Ambulatory Visit
Admission: RE | Admit: 2021-11-25 | Discharge: 2021-11-25 | Disposition: A | Discharge status: Home / Self Care | Payer: BC Managed Care – PPO | Source: Ambulatory Visit | Attending: Orthopedic Surgery | Admitting: Orthopedic Surgery

## 2021-11-25 DIAGNOSIS — M25611 Stiffness of right shoulder, not elsewhere classified: Secondary | ICD-10-CM

## 2021-11-25 DIAGNOSIS — M25511 Pain in right shoulder: Secondary | ICD-10-CM

## 2021-11-28 ENCOUNTER — Ambulatory Visit: Admission: RE | Admit: 2021-11-28 | Payer: BC Managed Care – PPO | Source: Ambulatory Visit

## 2021-12-10 DIAGNOSIS — E291 Testicular hypofunction: Secondary | ICD-10-CM | POA: Diagnosis not present

## 2021-12-17 DIAGNOSIS — E291 Testicular hypofunction: Secondary | ICD-10-CM | POA: Diagnosis not present

## 2021-12-17 DIAGNOSIS — R6882 Decreased libido: Secondary | ICD-10-CM | POA: Diagnosis not present

## 2021-12-17 DIAGNOSIS — R5382 Chronic fatigue, unspecified: Secondary | ICD-10-CM | POA: Diagnosis not present

## 2021-12-18 DIAGNOSIS — F1120 Opioid dependence, uncomplicated: Secondary | ICD-10-CM | POA: Diagnosis not present

## 2021-12-18 DIAGNOSIS — M961 Postlaminectomy syndrome, not elsewhere classified: Secondary | ICD-10-CM | POA: Diagnosis not present

## 2021-12-18 DIAGNOSIS — G90522 Complex regional pain syndrome I of left lower limb: Secondary | ICD-10-CM | POA: Diagnosis not present

## 2021-12-18 DIAGNOSIS — Z9689 Presence of other specified functional implants: Secondary | ICD-10-CM | POA: Diagnosis not present

## 2022-01-10 ENCOUNTER — Encounter: Payer: Self-pay | Admitting: Adult Health

## 2022-01-10 ENCOUNTER — Ambulatory Visit (INDEPENDENT_AMBULATORY_CARE_PROVIDER_SITE_OTHER): Payer: BC Managed Care – PPO | Admitting: Adult Health

## 2022-01-10 VITALS — BP 140/80 | HR 85 | Temp 99.3°F | Ht 70.75 in | Wt 290.0 lb

## 2022-01-10 DIAGNOSIS — Z0000 Encounter for general adult medical examination without abnormal findings: Secondary | ICD-10-CM

## 2022-01-10 DIAGNOSIS — I10 Essential (primary) hypertension: Secondary | ICD-10-CM

## 2022-01-10 DIAGNOSIS — R7989 Other specified abnormal findings of blood chemistry: Secondary | ICD-10-CM | POA: Diagnosis not present

## 2022-01-10 DIAGNOSIS — Z6841 Body Mass Index (BMI) 40.0 and over, adult: Secondary | ICD-10-CM | POA: Diagnosis not present

## 2022-01-10 DIAGNOSIS — Z125 Encounter for screening for malignant neoplasm of prostate: Secondary | ICD-10-CM

## 2022-01-10 DIAGNOSIS — E785 Hyperlipidemia, unspecified: Secondary | ICD-10-CM

## 2022-01-10 DIAGNOSIS — G479 Sleep disorder, unspecified: Secondary | ICD-10-CM

## 2022-01-10 DIAGNOSIS — M545 Low back pain, unspecified: Secondary | ICD-10-CM

## 2022-01-10 DIAGNOSIS — G8929 Other chronic pain: Secondary | ICD-10-CM

## 2022-01-10 LAB — CBC WITH DIFFERENTIAL/PLATELET
Basophils Absolute: 0.1 10*3/uL (ref 0.0–0.1)
Basophils Relative: 0.9 % (ref 0.0–3.0)
Eosinophils Absolute: 0.5 10*3/uL (ref 0.0–0.7)
Eosinophils Relative: 7 % — ABNORMAL HIGH (ref 0.0–5.0)
HCT: 51.8 % (ref 39.0–52.0)
Hemoglobin: 17.4 g/dL — ABNORMAL HIGH (ref 13.0–17.0)
Lymphocytes Relative: 32.8 % (ref 12.0–46.0)
Lymphs Abs: 2.2 10*3/uL (ref 0.7–4.0)
MCHC: 33.7 g/dL (ref 30.0–36.0)
MCV: 90 fl (ref 78.0–100.0)
Monocytes Absolute: 0.6 10*3/uL (ref 0.1–1.0)
Monocytes Relative: 9.6 % (ref 3.0–12.0)
Neutro Abs: 3.3 10*3/uL (ref 1.4–7.7)
Neutrophils Relative %: 49.7 % (ref 43.0–77.0)
Platelets: 214 10*3/uL (ref 150.0–400.0)
RBC: 5.75 Mil/uL (ref 4.22–5.81)
RDW: 12.8 % (ref 11.5–15.5)
WBC: 6.7 10*3/uL (ref 4.0–10.5)

## 2022-01-10 LAB — LIPID PANEL
Cholesterol: 114 mg/dL (ref 0–200)
HDL: 34 mg/dL — ABNORMAL LOW (ref >39.00)
LDL Cholesterol: 62 mg/dL (ref 0–99)
NonHDL: 80.47
Total CHOL/HDL Ratio: 3
Triglycerides: 90 mg/dL (ref 0.0–149.0)
VLDL: 18 mg/dL (ref 0.0–40.0)

## 2022-01-10 LAB — COMPREHENSIVE METABOLIC PANEL
ALT: 69 U/L — ABNORMAL HIGH (ref 0–53)
AST: 43 U/L — ABNORMAL HIGH (ref 0–37)
Albumin: 4.9 g/dL (ref 3.5–5.2)
Alkaline Phosphatase: 56 U/L (ref 39–117)
BUN: 17 mg/dL (ref 6–23)
CO2: 25 mEq/L (ref 19–32)
Calcium: 9.4 mg/dL (ref 8.4–10.5)
Chloride: 103 mEq/L (ref 96–112)
Creatinine, Ser: 0.83 mg/dL (ref 0.40–1.50)
GFR: 101.1 mL/min (ref >60.00)
Glucose, Bld: 106 mg/dL — ABNORMAL HIGH (ref 70–99)
Potassium: 4.1 mEq/L (ref 3.5–5.1)
Sodium: 137 mEq/L (ref 135–145)
Total Bilirubin: 0.8 mg/dL (ref 0.2–1.2)
Total Protein: 7.9 g/dL (ref 6.0–8.3)

## 2022-01-10 LAB — HEMOGLOBIN A1C: Hgb A1c MFr Bld: 5.9 % (ref 4.6–6.5)

## 2022-01-10 LAB — TSH: TSH: 1.27 u[IU]/mL (ref 0.35–5.50)
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
## 2022-01-20 ENCOUNTER — Other Ambulatory Visit: Payer: Self-pay | Admitting: Adult Health

## 2022-01-20 DIAGNOSIS — E785 Hyperlipidemia, unspecified: Secondary | ICD-10-CM

## 2022-01-20 DIAGNOSIS — I10 Essential (primary) hypertension: Secondary | ICD-10-CM

## 2022-01-23 ENCOUNTER — Other Ambulatory Visit (HOSPITAL_COMMUNITY): Payer: Self-pay | Admitting: Orthopedic Surgery

## 2022-01-23 DIAGNOSIS — M25611 Stiffness of right shoulder, not elsewhere classified: Secondary | ICD-10-CM

## 2022-01-23 DIAGNOSIS — M25511 Pain in right shoulder: Secondary | ICD-10-CM

## 2022-01-30 DIAGNOSIS — Z125 Encounter for screening for malignant neoplasm of prostate: Secondary | ICD-10-CM | POA: Diagnosis not present

## 2022-01-30 DIAGNOSIS — E291 Testicular hypofunction: Secondary | ICD-10-CM | POA: Diagnosis not present

## 2022-02-07 ENCOUNTER — Telehealth: Payer: Self-pay | Admitting: Adult Health

## 2022-02-07 NOTE — Telephone Encounter (Signed)
Patient stopped by because he needs his history/last physical sent over to  [facility] Radiology Department. He will be having a procedure done in October (MRI with anesthesia). I called the imaging department [phone]) and got their fax number and an alternative number. I faxed papers from [place] over to [phone]    Alternative fax is [phone]        FYI/please advise

## 2022-02-12 ENCOUNTER — Ambulatory Visit (INDEPENDENT_AMBULATORY_CARE_PROVIDER_SITE_OTHER): Payer: BC Managed Care – PPO | Admitting: Adult Health

## 2022-02-12 ENCOUNTER — Encounter: Payer: Self-pay | Admitting: Adult Health

## 2022-02-12 VITALS — BP 138/84 | HR 98 | Temp 99.3°F | Wt 292.2 lb

## 2022-02-12 DIAGNOSIS — Z01818 Encounter for other preprocedural examination: Secondary | ICD-10-CM

## 2022-02-12 DIAGNOSIS — Z6841 Body Mass Index (BMI) 40.0 and over, adult: Secondary | ICD-10-CM

## 2022-02-12 LAB — CBC WITH DIFFERENTIAL/PLATELET
Basophils Absolute: 0.1 10*3/uL (ref 0.0–0.1)
Basophils Relative: 0.9 % (ref 0.0–3.0)
Eosinophils Absolute: 0.6 10*3/uL (ref 0.0–0.7)
Eosinophils Relative: 6.4 % — ABNORMAL HIGH (ref 0.0–5.0)
HCT: 51.8 % (ref 39.0–52.0)
Hemoglobin: 17.6 g/dL — ABNORMAL HIGH (ref 13.0–17.0)
Lymphocytes Relative: 29.2 % (ref 12.0–46.0)
Lymphs Abs: 2.5 10*3/uL (ref 0.7–4.0)
MCHC: 34 g/dL (ref 30.0–36.0)
MCV: 88.6 fl (ref 78.0–100.0)
Monocytes Absolute: 0.9 10*3/uL (ref 0.1–1.0)
Monocytes Relative: 10 % (ref 3.0–12.0)
Neutro Abs: 4.6 10*3/uL (ref 1.4–7.7)
Neutrophils Relative %: 53.5 % (ref 43.0–77.0)
Platelets: 213 10*3/uL (ref 150.0–400.0)
RBC: 5.85 Mil/uL — ABNORMAL HIGH (ref 4.22–5.81)
RDW: 12.8 % (ref 11.5–15.5)
WBC: 8.7 10*3/uL (ref 4.0–10.5)

## 2022-02-12 LAB — COMPREHENSIVE METABOLIC PANEL
ALT: 59 U/L — ABNORMAL HIGH (ref 0–53)
AST: 42 U/L — ABNORMAL HIGH (ref 0–37)
Albumin: 4.8 g/dL (ref 3.5–5.2)
Alkaline Phosphatase: 52 U/L (ref 39–117)
BUN: 19 mg/dL (ref 6–23)
CO2: 26 mEq/L (ref 19–32)
Calcium: 9.6 mg/dL (ref 8.4–10.5)
Chloride: 102 mEq/L (ref 96–112)
Creatinine, Ser: 0.84 mg/dL (ref 0.40–1.50)
GFR: 100.67 mL/min (ref >60.00)
Glucose, Bld: 102 mg/dL — ABNORMAL HIGH (ref 70–99)
Potassium: 4.1 mEq/L (ref 3.5–5.1)
Sodium: 137 mEq/L (ref 135–145)
Total Bilirubin: 0.6 mg/dL (ref 0.2–1.2)
Total Protein: 7.7 g/dL (ref 6.0–8.3)

## 2022-02-12 LAB — PROTIME-INR
INR: 1 ratio (ref 0.8–1.0)
Prothrombin Time: 10.7 s (ref 9.6–13.1)

## 2022-02-12 MED ORDER — WEGOVY 0.25 MG/0.5ML ~~LOC~~ SOAJ: 0.2500 mg | SUBCUTANEOUS | 0 refills | Status: DC

## 2022-02-12 NOTE — Progress Notes (Signed)
Subjective:    Patient ID: Bruce Terry, male    DOB: [birth year], 51 y.o.   MRN: [id number]  HPI 51 year old male who  has a past medical history of Hemorrhoid, Hyperlipidemia, Hypertension, and Low back pain.  He is having an MRI under sedation for right shoulder. This will be done in the hospital. He reports that " the physical I had a month ago was not valid any longer."   Additionally, he would like to have me send in Wegovy 0.25 mg as the pharmacy has not received any recent supplies of the 0.5 mg dose.     Review of Systems See HPI   Past Medical History:  Diagnosis Date   Hemorrhoid    Hyperlipidemia    Hypertension    Low back pain     Social History   Socioeconomic History   Marital status: Married    Spouse name: Not on file   Number of children: Not on file   Years of education: Not on file   Highest education level: Not on file  Occupational History   Not on file  Tobacco Use   Smoking status: Never   Smokeless tobacco: Never  Vaping Use   Vaping Use: Never used  Substance and Sexual Activity   Alcohol use: Not Currently    Comment: occasional   Drug use: No   Sexual activity: Not on file  Other Topics Concern   Not on file  Social History Narrative   Not on file   Social Determinants of Health   Financial Resource Strain: Not on file  Food Insecurity: Not on file  Transportation Needs: Not on file  Physical Activity: Not on file  Stress: Not on file  Social Connections: Not on file  Intimate Partner Violence: Not on file    Past Surgical History:  Procedure Laterality Date   BACK SURGERY     x 5   COLONOSCOPY     ELBOW SURGERY Right    EYE SURGERY Bilateral 2022   cataracts   RADIOLOGY WITH ANESTHESIA N/A 02/08/2021   Procedure: MRI WITH ANESTHESIA WITH/WITHOUT CONTRAST;  Surgeon: Radiologist, Medication, MD;  Location: [place];  Service: Radiology;  Laterality: N/A;   right hand surgery Right    SHOULDER SURGERY Right     SPINAL CORD STIMULATOR IMPLANT  02/07/2021   at the [place] [facility]    Family History  Problem Relation Age of Onset   Hypertension Mother    Cirrhosis Mother    Diabetes Mother    Hypothyroidism Mother    Obesity Mother    Kidney disease Mother    Alcohol abuse Father    Colon cancer Neg Hx    Esophageal cancer Neg Hx    Rectal cancer Neg Hx    Stomach cancer Neg Hx     Allergies  Allergen Reactions   Morphine And Related     GI upset    Current Outpatient Medications on File Prior to Visit  Medication Sig Dispense Refill   atorvastatin (LIPITOR) 80 MG tablet TAKE 1 TABLET BY MOUTH EVERY DAY 90 tablet 0   cyclobenzaprine (FLEXERIL) 10 MG tablet Take 10 mg by mouth at bedtime as needed for muscle spasms.     lisinopril (ZESTRIL) 20 MG tablet TAKE 1 TABLET BY MOUTH EVERYDAY AT BEDTIME 90 tablet 0   Oxycodone HCl 10 MG TABS Take 10 mg by mouth every 6 (six) hours as needed.  testosterone cypionate (DEPOTESTOSTERONE CYPIONATE) 200 MG/ML injection SMARTSIG:0.5 Milliliter(s) IM Once a Week     amitriptyline (ELAVIL) 50 MG tablet Take 1 tablet (50 mg total) by mouth at bedtime. (Patient not taking: Reported on 02/12/2022) 30 tablet 0   omeprazole (PRILOSEC) 20 MG capsule Take 1 capsule (20 mg total) by mouth daily. (Patient not taking: Reported on 02/12/2022) 30 capsule 3   No current facility-administered medications on file prior to visit.    BP 138/84 (BP Location: Left Arm, Patient Position: Sitting, Cuff Size: Large)   Pulse 98   Temp 99.3 F (37.4 C) (Oral)   Wt 292 lb 3.2 oz (132.5 kg)   SpO2 97%   BMI 41.04 kg/m       Objective:   Physical Exam Vitals and nursing note reviewed.  Constitutional:      Appearance: Normal appearance. He is obese.  Cardiovascular:     Rate and Rhythm: Normal rate and regular rhythm.     Pulses: Normal pulses.     Heart sounds: Normal heart sounds.  Pulmonary:     Effort: Pulmonary effort is normal.     Breath  sounds: Normal breath sounds.  Musculoskeletal:        General: Normal range of motion.  Skin:    General: Skin is warm and dry.     Capillary Refill: Capillary refill takes less than 2 seconds.  Neurological:     General: No focal deficit present.     Mental Status: He is alert and oriented to person, place, and time.  Psychiatric:        Mood and Affect: Mood normal.        Behavior: Behavior normal.        Thought Content: Thought content normal.        Judgment: Judgment normal.       Assessment & Plan:  1. Preoperative testing  - EKG 12-Lead- NSR, rate 87  - CBC with Differential/Platelet; Future - Comprehensive metabolic panel; Future - Protime-INR; Future - DG Chest 2 View; Future  2. Morbid obesity with BMI of 40.0-44.9, adult (HCC)  - Semaglutide-Weight Management (WEGOVY) 0.25 MG/0.5ML SOAJ; Inject 0.25 mg into the skin once a week.  Dispense: 2 mL; Refill: 0  Time spent with patient today was 32 minutes which consisted of chart review, discussing diagnosis, work up, treatment answering questions and documentation.

## 2022-02-14 ENCOUNTER — Other Ambulatory Visit: Payer: BC Managed Care – PPO

## 2022-02-14 ENCOUNTER — Ambulatory Visit (INDEPENDENT_AMBULATORY_CARE_PROVIDER_SITE_OTHER): Payer: BC Managed Care – PPO

## 2022-02-14 DIAGNOSIS — Z01818 Encounter for other preprocedural examination: Secondary | ICD-10-CM

## 2022-02-14 DIAGNOSIS — Z0389 Encounter for observation for other suspected diseases and conditions ruled out: Secondary | ICD-10-CM | POA: Diagnosis not present

## 2022-03-04 ENCOUNTER — Encounter (HOSPITAL_COMMUNITY): Payer: Self-pay

## 2022-03-04 ENCOUNTER — Encounter (HOSPITAL_COMMUNITY): Payer: Self-pay | Admitting: Orthopedic Surgery

## 2022-03-04 ENCOUNTER — Other Ambulatory Visit: Payer: Self-pay

## 2022-03-04 NOTE — Progress Notes (Signed)
PCP - [name], NP  Chest x-ray - 02/14/22 EKG - 02/12/22 Stress Test - Denies ECHO - Denies Cardiac Cath - Denies  CPAP - denies  Knows to bring Stimulator remote  [place] last dose: Unable to get since April  ERAS Protcol - Clears until 0500  Anesthesia review: N  Patient verbally denies any shortness of breath, fever, cough and chest pain during phone call   -------------  SDW INSTRUCTIONS given:  Your procedure is scheduled on 03/05/22.  Report to [facility] Main Entrance "A" at 0530 A.M., and check in at the Admitting office.  Call this number if you have problems the morning of surgery:  [phone]   Remember:  Do not eat after midnight the night before your surgery  You may drink clear liquids until 0500 the morning of your surgery.   Clear liquids allowed are: Water, Non-Citrus Juices (without pulp), Carbonated Beverages, Clear Tea, Black Coffee Only, and Gatorade    Take these medicines the morning of surgery with A SIP OF WATER  Flexeril-if needed Oxycodone-if needed  As of today, stop taking all herbal medications, fish oil, and all vitamins.                      Do not wear jewelry, make up, or nail polish            Do not wear lotions, powders, perfumes/colognes, or deodorant.            Do not shave 48 hours prior to surgery.  Men may shave face and neck.            Do not bring valuables to the hospital.            [facility] is not responsible for any belongings or valuables.  Do NOT Smoke (Tobacco/Vaping) 24 hours prior to your procedure If you use a CPAP at night, you may bring all equipment for your overnight stay.   Contacts, glasses, dentures or bridgework may not be worn into surgery.      For patients admitted to the hospital, discharge time will be determined by your treatment team.   Patients discharged the day of surgery will not be allowed to drive home, and someone needs to stay with them for 24 hours.    Special  instructions:     Day of Surgery: Please shower morning of surgery  Wear Clean/Comfortable clothing the morning of surgery Do not apply any deodorants/lotions.   Remember to brush your teeth WITH YOUR REGULAR TOOTHPASTE.   Questions were answered. Patient verbalized understanding of instructions.

## 2022-03-05 ENCOUNTER — Encounter (HOSPITAL_COMMUNITY): Payer: Self-pay

## 2022-03-05 ENCOUNTER — Ambulatory Visit (HOSPITAL_COMMUNITY)
Admission: RE | Admit: 2022-03-05 | Discharge: 2022-03-05 | Disposition: A | Discharge status: Home / Self Care | Payer: BC Managed Care – PPO | Attending: Orthopedic Surgery | Admitting: Orthopedic Surgery

## 2022-03-05 ENCOUNTER — Encounter (HOSPITAL_COMMUNITY)
Admission: RE | Disposition: A | Discharge status: Home / Self Care | Payer: Self-pay | Source: Home / Self Care | Attending: Orthopedic Surgery

## 2022-03-05 ENCOUNTER — Ambulatory Visit (HOSPITAL_COMMUNITY)
Admission: RE | Admit: 2022-03-05 | Discharge: 2022-03-05 | Disposition: A | Discharge status: Home / Self Care | Payer: BC Managed Care – PPO | Source: Ambulatory Visit | Attending: Orthopedic Surgery | Admitting: Orthopedic Surgery

## 2022-03-05 DIAGNOSIS — M25511 Pain in right shoulder: Secondary | ICD-10-CM | POA: Diagnosis not present

## 2022-03-05 DIAGNOSIS — Z538 Procedure and treatment not carried out for other reasons: Secondary | ICD-10-CM | POA: Insufficient documentation

## 2022-03-05 SURGERY — MRI WITH ANESTHESIA
Anesthesia: General | Laterality: Right

## 2022-03-05 MED ORDER — CHLORHEXIDINE GLUCONATE 0.12 % MT SOLN
15.0000 mL | Freq: Once | OROMUCOSAL | Status: DC
  Filled 2022-03-05: qty 15

## 2022-03-05 MED ORDER — ORAL CARE MOUTH RINSE: 15.0000 mL | Freq: Once | OROMUCOSAL | Status: DC

## 2022-03-05 MED ORDER — LACTATED RINGERS IV SOLN: INTRAVENOUS | Status: DC

## 2022-03-05 NOTE — Progress Notes (Signed)
Patient stated his nerve stimulator battery is low. Unable to place nerve stimulator in MRI mode. MRI confirmed that stimulator would need to be placed in MRI mode to do MRI. Pt's nerve stimulator charger is at home. Patient will need to be rescheduled for MRI per MRI department. Patient educated to call Dr. Jeoffrey Massed office to re-schedule MRI. Pt verbalized understanding. Patient discharged home with wife.

## 2022-03-12 DIAGNOSIS — G894 Chronic pain syndrome: Secondary | ICD-10-CM | POA: Diagnosis not present

## 2022-03-12 DIAGNOSIS — M961 Postlaminectomy syndrome, not elsewhere classified: Secondary | ICD-10-CM | POA: Diagnosis not present

## 2022-03-12 DIAGNOSIS — G90522 Complex regional pain syndrome I of left lower limb: Secondary | ICD-10-CM | POA: Diagnosis not present

## 2022-03-12 DIAGNOSIS — F1120 Opioid dependence, uncomplicated: Secondary | ICD-10-CM | POA: Diagnosis not present

## 2022-03-13 DIAGNOSIS — Z125 Encounter for screening for malignant neoplasm of prostate: Secondary | ICD-10-CM | POA: Diagnosis not present

## 2022-03-13 DIAGNOSIS — E291 Testicular hypofunction: Secondary | ICD-10-CM | POA: Diagnosis not present

## 2022-03-15 ENCOUNTER — Ambulatory Visit (INDEPENDENT_AMBULATORY_CARE_PROVIDER_SITE_OTHER): Payer: BC Managed Care – PPO

## 2022-03-15 ENCOUNTER — Ambulatory Visit (INDEPENDENT_AMBULATORY_CARE_PROVIDER_SITE_OTHER): Payer: BC Managed Care – PPO | Admitting: Adult Health

## 2022-03-15 ENCOUNTER — Encounter: Payer: Self-pay | Admitting: Adult Health

## 2022-03-15 VITALS — BP 150/80 | HR 90 | Temp 98.5°F | Ht 71.0 in | Wt 296.0 lb

## 2022-03-15 DIAGNOSIS — Z01818 Encounter for other preprocedural examination: Secondary | ICD-10-CM | POA: Diagnosis not present

## 2022-03-15 LAB — COMPREHENSIVE METABOLIC PANEL
ALT: 57 U/L — ABNORMAL HIGH (ref 0–53)
AST: 37 U/L (ref 0–37)
Albumin: 4.7 g/dL (ref 3.5–5.2)
Alkaline Phosphatase: 46 U/L (ref 39–117)
BUN: 14 mg/dL (ref 6–23)
CO2: 29 mEq/L (ref 19–32)
Calcium: 9.6 mg/dL (ref 8.4–10.5)
Chloride: 102 mEq/L (ref 96–112)
Creatinine, Ser: 0.85 mg/dL (ref 0.40–1.50)
GFR: 100.25 mL/min (ref >60.00)
Glucose, Bld: 126 mg/dL — ABNORMAL HIGH (ref 70–99)
Potassium: 4.3 mEq/L (ref 3.5–5.1)
Sodium: 140 mEq/L (ref 135–145)
Total Bilirubin: 0.6 mg/dL (ref 0.2–1.2)
Total Protein: 7.4 g/dL (ref 6.0–8.3)

## 2022-03-15 LAB — CBC WITH DIFFERENTIAL/PLATELET
Basophils Absolute: 0.1 10*3/uL (ref 0.0–0.1)
Basophils Relative: 0.8 % (ref 0.0–3.0)
Eosinophils Absolute: 0.6 10*3/uL (ref 0.0–0.7)
Eosinophils Relative: 7.4 % — ABNORMAL HIGH (ref 0.0–5.0)
HCT: 49.6 % (ref 39.0–52.0)
Hemoglobin: 17 g/dL (ref 13.0–17.0)
Lymphocytes Relative: 31.4 % (ref 12.0–46.0)
Lymphs Abs: 2.5 10*3/uL (ref 0.7–4.0)
MCHC: 34.2 g/dL (ref 30.0–36.0)
MCV: 88.8 fl (ref 78.0–100.0)
Monocytes Absolute: 0.7 10*3/uL (ref 0.1–1.0)
Monocytes Relative: 8.7 % (ref 3.0–12.0)
Neutro Abs: 4.1 10*3/uL (ref 1.4–7.7)
Neutrophils Relative %: 51.7 % (ref 43.0–77.0)
Platelets: 237 10*3/uL (ref 150.0–400.0)
RBC: 5.59 Mil/uL (ref 4.22–5.81)
RDW: 13 % (ref 11.5–15.5)
WBC: 7.9 10*3/uL (ref 4.0–10.5)

## 2022-03-15 LAB — PROTIME-INR
INR: 1.1 ratio — ABNORMAL HIGH (ref 0.8–1.0)
Prothrombin Time: 11.6 s (ref 9.6–13.1)

## 2022-03-15 NOTE — Progress Notes (Signed)
Subjective:    Patient ID: Bruce Terry, male    DOB: April 10, 1971, 51 y.o.   MRN: [id number]  HPI 51 year old male who  has a past medical history of Hemorrhoid, Hyperlipidemia, Hypertension, and Low back pain.  He presents to the office today for recurrent preoperative testing.  He is having an MRI under sedation for her right shoulder.  This will be done at the hospital.  He had preoperative testing done about a month ago but his window closed before the MRI could be done.  He is now scheduled for the MRI on April 09, 2022 and was told that he has to have his testing already done before the MRI   Review of Systems See HPI   Past Medical History:  Diagnosis Date   Hemorrhoid    Hyperlipidemia    Hypertension    Low back pain     Social History   Socioeconomic History   Marital status: Married    Spouse name: Not on file   Number of children: Not on file   Years of education: Not on file   Highest education level: Not on file  Occupational History   Not on file  Tobacco Use   Smoking status: Never   Smokeless tobacco: Never  Vaping Use   Vaping Use: Never used  Substance and Sexual Activity   Alcohol use: Not Currently    Comment: occasional   Drug use: No   Sexual activity: Not on file  Other Topics Concern   Not on file  Social History Narrative   Not on file   Social Determinants of Health   Financial Resource Strain: Not on file  Food Insecurity: Not on file  Transportation Needs: Not on file  Physical Activity: Not on file  Stress: Not on file  Social Connections: Not on file  Intimate Partner Violence: Not on file    Past Surgical History:  Procedure Laterality Date   BACK SURGERY     x 5   COLONOSCOPY     ELBOW SURGERY Right    EYE SURGERY Bilateral 2022   cataracts   RADIOLOGY WITH ANESTHESIA N/A 02/08/2021   Procedure: MRI WITH ANESTHESIA WITH/WITHOUT CONTRAST;  Surgeon: Radiologist, Medication, MD;  Location: [place];  Service:  Radiology;  Laterality: N/A;   right hand surgery Right    SHOULDER SURGERY Right    SPINAL CORD STIMULATOR IMPLANT  02/07/2021   at the [place] [facility]    Family History  Problem Relation Age of Onset   Hypertension Mother    Cirrhosis Mother    Diabetes Mother    Hypothyroidism Mother    Obesity Mother    Kidney disease Mother    Alcohol abuse Father    Colon cancer Neg Hx    Esophageal cancer Neg Hx    Rectal cancer Neg Hx    Stomach cancer Neg Hx     Allergies  Allergen Reactions   Amitriptyline Nausea And Vomiting   Morphine And Related     GI upset    Current Outpatient Medications on File Prior to Visit  Medication Sig Dispense Refill   anastrozole (ARIMIDEX) 1 MG tablet Take 1 mg by mouth 2 (two) times a week. Mondays and Thursdays     atorvastatin (LIPITOR) 80 MG tablet TAKE 1 TABLET BY MOUTH EVERY DAY (Patient taking differently: Take 80 mg by mouth at bedtime.) 90 tablet 0   cyclobenzaprine (FLEXERIL) 10 MG tablet Take 10  mg by mouth at bedtime as needed for muscle spasms.     lisinopril (ZESTRIL) 20 MG tablet TAKE 1 TABLET BY MOUTH EVERYDAY AT BEDTIME 90 tablet 0   Melatonin 10 MG CAPS Take 10 mg by mouth at bedtime as needed (sleep).     Multiple Vitamin (MULTIVITAMIN WITH MINERALS) TABS tablet Take 1 tablet by mouth daily.     omeprazole (PRILOSEC) 20 MG capsule Take 1 capsule (20 mg total) by mouth daily. 30 capsule 3   oxyCODONE ER (XTAMPZA ER) 13.5 MG C12A Take 13.5 mg by mouth 2 (two) times daily as needed (severe pain).     Oxycodone HCl 10 MG TABS Take 10 mg by mouth every 6 (six) hours as needed (pain).     Semaglutide-Weight Management (WEGOVY) 0.25 MG/0.5ML SOAJ Inject 0.25 mg into the skin once a week. 2 mL 0   testosterone cypionate (DEPOTESTOSTERONE CYPIONATE) 200 MG/ML injection Inject 150 mg into the skin every Sunday.     No current facility-administered medications on file prior to visit.    BP (!) 150/80   Pulse 90   Temp  98.5 F (36.9 C) (Oral)   Ht '5\' 11"'$  (1.803 m)   Wt 296 lb (134.3 kg)   SpO2 98%   BMI 41.28 kg/m       Objective:   Physical Exam Vitals and nursing note reviewed.  Constitutional:      Appearance: Normal appearance. He is obese.  Cardiovascular:     Rate and Rhythm: Normal rate and regular rhythm.     Pulses: Normal pulses.     Heart sounds: Normal heart sounds.  Pulmonary:     Effort: Pulmonary effort is normal.     Breath sounds: Normal breath sounds.  Abdominal:     General: Abdomen is flat. Bowel sounds are normal.     Palpations: Abdomen is soft.  Musculoskeletal:        General: Tenderness present.  Skin:    General: Skin is warm and dry.  Neurological:     General: No focal deficit present.     Mental Status: He is alert and oriented to person, place, and time.  Psychiatric:        Mood and Affect: Mood normal.        Behavior: Behavior normal.        Thought Content: Thought content normal.        Judgment: Judgment normal.        Assessment & Plan:  1. Preoperative testing  - CBC with Differential/Platelet; Future - Comprehensive metabolic panel; Future - Protime-INR; Future - DG Chest 2 View; Future - EKG 12-Lead- Tachycardia, Rate 102 - slightly elevated heart rate but patient is in quite a bit of pain today. Ok to go through with MRI with sedation   [name], NP

## 2022-03-20 DIAGNOSIS — E291 Testicular hypofunction: Secondary | ICD-10-CM | POA: Diagnosis not present

## 2022-03-20 DIAGNOSIS — R6882 Decreased libido: Secondary | ICD-10-CM | POA: Diagnosis not present

## 2022-03-20 DIAGNOSIS — N5201 Erectile dysfunction due to arterial insufficiency: Secondary | ICD-10-CM | POA: Diagnosis not present

## 2022-04-08 ENCOUNTER — Encounter (HOSPITAL_COMMUNITY): Payer: Self-pay | Admitting: *Deleted

## 2022-04-08 NOTE — Progress Notes (Signed)
Unable to reach patient via phone.  LMOM with detail instructions for DOS.  PCP - [name], NP Cardiologist - n/a  Chest x-ray - 03/15/22 (2V) EKG - 03/15/22 Stress Test - n/a ECHO - n/a Cardiac Cath - n/a  ICD Pacemaker/Loop - n/a  Sleep Study -  n/a CPAP - none  Do not take Semaglutide on the morning of surgery.    If your blood sugar is less than 70 mg/dL, you will need to treat for low blood sugar: Treat a low blood sugar (less than 70 mg/dL) with  cup of clear juice (cranberry or apple), 4 glucose tablets, OR glucose gel. Recheck blood sugar in 15 minutes after treatment (to make sure it is greater than 70 mg/dL). If your blood sugar is not greater than 70 mg/dL on recheck, call [phone] for further instructions.  STOP now taking any Aspirin (unless otherwise instructed by your surgeon), Aleve, Naproxen, Ibuprofen, Motrin, Advil, Goody's, BC's, all herbal medications, fish oil, and all vitamins.   Bring Stimulator Remote (fully charged) with you on DOS.

## 2022-04-09 ENCOUNTER — Encounter (HOSPITAL_COMMUNITY): Payer: Self-pay

## 2022-04-09 ENCOUNTER — Ambulatory Visit (HOSPITAL_COMMUNITY): Admission: RE | Admit: 2022-04-09 | Payer: BC Managed Care – PPO | Source: Ambulatory Visit

## 2022-04-09 HISTORY — DX: Gastro-esophageal reflux disease without esophagitis: K21.9

## 2022-04-09 SURGERY — MRI WITH ANESTHESIA
Anesthesia: General | Laterality: Right

## 2022-04-25 ENCOUNTER — Encounter: Payer: Self-pay | Admitting: Adult Health

## 2022-04-25 ENCOUNTER — Ambulatory Visit (INDEPENDENT_AMBULATORY_CARE_PROVIDER_SITE_OTHER): Payer: BC Managed Care – PPO

## 2022-04-25 ENCOUNTER — Ambulatory Visit (INDEPENDENT_AMBULATORY_CARE_PROVIDER_SITE_OTHER): Payer: BC Managed Care – PPO | Admitting: Adult Health

## 2022-04-25 ENCOUNTER — Other Ambulatory Visit: Payer: Self-pay | Admitting: Adult Health

## 2022-04-25 VITALS — BP 140/100 | HR 90 | Temp 98.7°F | Ht 71.0 in

## 2022-04-25 DIAGNOSIS — Z01818 Encounter for other preprocedural examination: Secondary | ICD-10-CM

## 2022-04-25 DIAGNOSIS — I10 Essential (primary) hypertension: Secondary | ICD-10-CM | POA: Diagnosis not present

## 2022-04-25 DIAGNOSIS — E785 Hyperlipidemia, unspecified: Secondary | ICD-10-CM

## 2022-04-25 LAB — COMPREHENSIVE METABOLIC PANEL
ALT: 64 U/L — ABNORMAL HIGH (ref 0–53)
AST: 38 U/L — ABNORMAL HIGH (ref 0–37)
Albumin: 4.6 g/dL (ref 3.5–5.2)
Alkaline Phosphatase: 58 U/L (ref 39–117)
BUN: 18 mg/dL (ref 6–23)
CO2: 29 mEq/L (ref 19–32)
Calcium: 9.4 mg/dL (ref 8.4–10.5)
Chloride: 103 mEq/L (ref 96–112)
Creatinine, Ser: 0.98 mg/dL (ref 0.40–1.50)
GFR: 88.9 mL/min (ref >60.00)
Glucose, Bld: 98 mg/dL (ref 70–99)
Potassium: 4.6 mEq/L (ref 3.5–5.1)
Sodium: 139 mEq/L (ref 135–145)
Total Bilirubin: 0.4 mg/dL (ref 0.2–1.2)
Total Protein: 7.7 g/dL (ref 6.0–8.3)

## 2022-04-25 LAB — CBC WITH DIFFERENTIAL/PLATELET
Basophils Absolute: 0.1 10*3/uL (ref 0.0–0.1)
Basophils Relative: 0.7 % (ref 0.0–3.0)
Eosinophils Absolute: 0.6 10*3/uL (ref 0.0–0.7)
Eosinophils Relative: 7.5 % — ABNORMAL HIGH (ref 0.0–5.0)
HCT: 49.5 % (ref 39.0–52.0)
Hemoglobin: 17 g/dL (ref 13.0–17.0)
Lymphocytes Relative: 37.3 % (ref 12.0–46.0)
Lymphs Abs: 3 10*3/uL (ref 0.7–4.0)
MCHC: 34.4 g/dL (ref 30.0–36.0)
MCV: 87.7 fl (ref 78.0–100.0)
Monocytes Absolute: 0.8 10*3/uL (ref 0.1–1.0)
Monocytes Relative: 9.5 % (ref 3.0–12.0)
Neutro Abs: 3.6 10*3/uL (ref 1.4–7.7)
Neutrophils Relative %: 45 % (ref 43.0–77.0)
Platelets: 283 10*3/uL (ref 150.0–400.0)
RBC: 5.64 Mil/uL (ref 4.22–5.81)
RDW: 13.1 % (ref 11.5–15.5)
WBC: 7.9 10*3/uL (ref 4.0–10.5)

## 2022-04-25 LAB — PROTIME-INR
INR: 1.1 ratio — ABNORMAL HIGH (ref 0.8–1.0)
Prothrombin Time: 12.2 s (ref 9.6–13.1)

## 2022-04-25 NOTE — Patient Instructions (Signed)
  [facility]  Address: [address], [place], [place] [address] Phone: [phone]

## 2022-04-25 NOTE — Progress Notes (Signed)
Subjective:    Patient ID: Bruce Terry, male    DOB: Feb 17, 1971, 51 y.o.   MRN: [id number]  HPI 51 year old male who  has a past medical history of GERD (gastroesophageal reflux disease), Hemorrhoid, Hyperlipidemia, Hypertension, and Low back pain.  Today for preoperative testing.  We missed his last appointment for an MRI and rotation of the right shoulder and has to repeat his preoperative testing due to being outside the window of 30 days from his last appointment.  He is now scheduled for the MRI on December 28th.   BP Readings from Last 3 Encounters:  04/25/22 (!) 140/100  03/15/22 (!) 150/80  03/05/22 (!) 156/91    Review of Systems  Constitutional: Negative.   Respiratory: Negative.    Cardiovascular: Negative.   Gastrointestinal: Negative.   Musculoskeletal:  Positive for arthralgias.  Neurological: Negative.   Hematological: Negative.   Psychiatric/Behavioral: Negative.     Past Medical History:  Diagnosis Date   GERD (gastroesophageal reflux disease)    Hemorrhoid    Hyperlipidemia    Hypertension    Low back pain     Social History   Socioeconomic History   Marital status: Married    Spouse name: Not on file   Number of children: Not on file   Years of education: Not on file   Highest education level: Not on file  Occupational History   Not on file  Tobacco Use   Smoking status: Never   Smokeless tobacco: Never  Vaping Use   Vaping Use: Never used  Substance and Sexual Activity   Alcohol use: Not Currently    Comment: occasional   Drug use: No   Sexual activity: Not on file  Other Topics Concern   Not on file  Social History Narrative   Not on file   Social Determinants of Health   Financial Resource Strain: Not on file  Food Insecurity: Not on file  Transportation Needs: Not on file  Physical Activity: Not on file  Stress: Not on file  Social Connections: Not on file  Intimate Partner Violence: Not on file    Past Surgical  History:  Procedure Laterality Date   BACK SURGERY     x 5   COLONOSCOPY     ELBOW SURGERY Right    EYE SURGERY Bilateral 2022   cataracts   RADIOLOGY WITH ANESTHESIA N/A 02/08/2021   Procedure: MRI WITH ANESTHESIA WITH/WITHOUT CONTRAST;  Surgeon: Radiologist, Medication, MD;  Location: [place];  Service: Radiology;  Laterality: N/A;   right hand surgery Right    SHOULDER SURGERY Right    SPINAL CORD STIMULATOR IMPLANT  02/07/2021   at the [place] - has remote for stimulator    Family History  Problem Relation Age of Onset   Hypertension Mother    Cirrhosis Mother    Diabetes Mother    Hypothyroidism Mother    Obesity Mother    Kidney disease Mother    Alcohol abuse Father    Colon cancer Neg Hx    Esophageal cancer Neg Hx    Rectal cancer Neg Hx    Stomach cancer Neg Hx     Allergies  Allergen Reactions   Amitriptyline Nausea And Vomiting   Morphine And Related     GI upset    Current Outpatient Medications on File Prior to Visit  Medication Sig Dispense Refill   anastrozole (ARIMIDEX) 1 MG tablet Take 1 mg by mouth 2 (two)  times a week. Mondays and Thursdays     atorvastatin (LIPITOR) 80 MG tablet TAKE 1 TABLET BY MOUTH EVERY DAY (Patient taking differently: Take 80 mg by mouth at bedtime.) 90 tablet 0   cyclobenzaprine (FLEXERIL) 10 MG tablet Take 10 mg by mouth at bedtime as needed for muscle spasms.     lisinopril (ZESTRIL) 20 MG tablet TAKE 1 TABLET BY MOUTH EVERYDAY AT BEDTIME 90 tablet 0   Melatonin 10 MG CAPS Take 10 mg by mouth at bedtime as needed (sleep).     Multiple Vitamin (MULTIVITAMIN WITH MINERALS) TABS tablet Take 1 tablet by mouth daily.     omeprazole (PRILOSEC) 20 MG capsule Take 1 capsule (20 mg total) by mouth daily. 30 capsule 3   oxyCODONE ER (XTAMPZA ER) 13.5 MG C12A Take 13.5 mg by mouth 2 (two) times daily as needed (severe pain).     Oxycodone HCl 10 MG TABS Take 10 mg by mouth every 6 (six) hours as needed (pain).      Semaglutide-Weight Management (WEGOVY) 0.25 MG/0.5ML SOAJ Inject 0.25 mg into the skin once a week. 2 mL 0   testosterone cypionate (DEPOTESTOSTERONE CYPIONATE) 200 MG/ML injection Inject 150 mg into the skin every Sunday.     No current facility-administered medications on file prior to visit.    BP (!) 140/100   Pulse 90   Temp 98.7 F (37.1 C) (Oral)   Ht '5\' 11"'$  (1.803 m)   SpO2 98%   BMI 41.29 kg/m       Objective:   Physical Exam Vitals and nursing note reviewed.  Constitutional:      Appearance: Normal appearance.  Cardiovascular:     Pulses: Normal pulses.     Heart sounds: Normal heart sounds.  Pulmonary:     Effort: Pulmonary effort is normal.     Breath sounds: Normal breath sounds.  Abdominal:     General: Abdomen is flat. Bowel sounds are normal.     Palpations: Abdomen is soft.  Musculoskeletal:        General: Normal range of motion.  Skin:    General: Skin is warm and dry.  Neurological:     General: No focal deficit present.     Mental Status: He is alert and oriented to person, place, and time.  Psychiatric:        Mood and Affect: Mood normal.        Behavior: Behavior normal.        Thought Content: Thought content normal.        Judgment: Judgment normal.       Assessment & Plan:  1. Preoperative testing  - EKG 12-Lead- NSR, Rate 85  - CBC with Differential/Platelet; Future - Comprehensive metabolic panel; Future - DG Chest 2 View; Future - Protime-INR; Future - Protime-INR; Future  2. Essential hypertension - elevated today. Did not take his medication   [name], NP

## 2022-05-07 ENCOUNTER — Encounter (HOSPITAL_COMMUNITY): Payer: Self-pay | Admitting: *Deleted

## 2022-05-07 ENCOUNTER — Other Ambulatory Visit: Payer: Self-pay

## 2022-05-07 NOTE — Progress Notes (Addendum)
Mr. Ybarbo denies chest pain or shortness of breath.  Patient denies having any s/s of Covid in her household, also denies any known exposure to Covid.   Mr. Tanguma PCP is Dr. [name].  Patient 's Stop [place] is 6, I sent PCP a staff message.  Mr. Cremer has not started [facility] that has been ordered for weight loss, it is not available.

## 2022-05-08 NOTE — Anesthesia Preprocedure Evaluation (Signed)
Anesthesia Evaluation  Patient identified by MRN, date of birth, ID band Patient awake    Reviewed: Allergy & Precautions, NPO status , Patient's Chart, lab work & pertinent test results  Airway Mallampati: III  TM Distance: >3 FB Neck ROM: Full    Dental  (+) Teeth Intact, Dental Advisory Given   Pulmonary sleep apnea    Pulmonary exam normal breath sounds clear to auscultation       Cardiovascular hypertension, Pt. on medications Normal cardiovascular exam Rhythm:Regular Rate:Normal     Neuro/Psych SCS  negative psych ROS   GI/Hepatic Neg liver ROS,GERD  Medicated,,  Endo/Other  Obesity   Renal/GU negative Renal ROS     Musculoskeletal  RIGHT SHOULDER PAIN      DECREASED ROM OF RIGHT SHOULDER     Abdominal   Peds  Hematology negative hematology ROS (+)   Anesthesia Other Findings   Reproductive/Obstetrics                             Anesthesia Physical Anesthesia Plan  ASA: 2  Anesthesia Plan: General   Post-op Pain Management: Tylenol PO (pre-op)*   Induction: Intravenous  PONV Risk Score and Plan: 2 and Midazolam, Dexamethasone and Ondansetron  Airway Management Planned: LMA  Additional Equipment:   Intra-op Plan:   Post-operative Plan: Extubation in OR  Informed Consent: I have reviewed the patients History and Physical, chart, labs and discussed the procedure including the risks, benefits and alternatives for the proposed anesthesia with the patient or authorized representative who has indicated his/her understanding and acceptance.     Dental advisory given  Plan Discussed with: CRNA  Anesthesia Plan Comments:         Anesthesia Quick Evaluation

## 2022-05-09 ENCOUNTER — Ambulatory Visit (HOSPITAL_COMMUNITY)
Admission: RE | Admit: 2022-05-09 | Discharge: 2022-05-09 | Disposition: A | Discharge status: Home / Self Care | Payer: BC Managed Care – PPO | Source: Ambulatory Visit | Attending: Orthopedic Surgery | Admitting: Orthopedic Surgery

## 2022-05-09 ENCOUNTER — Ambulatory Visit (HOSPITAL_COMMUNITY): Payer: BC Managed Care – PPO | Admitting: Anesthesiology

## 2022-05-09 ENCOUNTER — Encounter (HOSPITAL_COMMUNITY): Admission: AD | Disposition: A | Discharge status: Home / Self Care | Payer: Self-pay | Source: Ambulatory Visit

## 2022-05-09 ENCOUNTER — Ambulatory Visit (HOSPITAL_COMMUNITY)
Admission: AD | Admit: 2022-05-09 | Discharge: 2022-05-09 | Disposition: A | Discharge status: Home / Self Care | Payer: BC Managed Care – PPO | Source: Ambulatory Visit | Attending: Orthopedic Surgery | Admitting: Orthopedic Surgery

## 2022-05-09 ENCOUNTER — Encounter (HOSPITAL_COMMUNITY): Payer: Self-pay

## 2022-05-09 ENCOUNTER — Other Ambulatory Visit: Payer: Self-pay

## 2022-05-09 DIAGNOSIS — M19011 Primary osteoarthritis, right shoulder: Secondary | ICD-10-CM | POA: Insufficient documentation

## 2022-05-09 DIAGNOSIS — M25511 Pain in right shoulder: Secondary | ICD-10-CM | POA: Diagnosis not present

## 2022-05-09 DIAGNOSIS — G4730 Sleep apnea, unspecified: Secondary | ICD-10-CM | POA: Insufficient documentation

## 2022-05-09 DIAGNOSIS — M7521 Bicipital tendinitis, right shoulder: Secondary | ICD-10-CM | POA: Diagnosis not present

## 2022-05-09 DIAGNOSIS — M67813 Other specified disorders of tendon, right shoulder: Secondary | ICD-10-CM | POA: Diagnosis not present

## 2022-05-09 DIAGNOSIS — K219 Gastro-esophageal reflux disease without esophagitis: Secondary | ICD-10-CM | POA: Diagnosis not present

## 2022-05-09 DIAGNOSIS — M75101 Unspecified rotator cuff tear or rupture of right shoulder, not specified as traumatic: Secondary | ICD-10-CM | POA: Diagnosis not present

## 2022-05-09 DIAGNOSIS — M25611 Stiffness of right shoulder, not elsewhere classified: Secondary | ICD-10-CM

## 2022-05-09 DIAGNOSIS — I10 Essential (primary) hypertension: Secondary | ICD-10-CM | POA: Diagnosis not present

## 2022-05-09 DIAGNOSIS — Z6839 Body mass index (BMI) 39.0-39.9, adult: Secondary | ICD-10-CM | POA: Diagnosis not present

## 2022-05-09 DIAGNOSIS — E669 Obesity, unspecified: Secondary | ICD-10-CM | POA: Insufficient documentation

## 2022-05-09 HISTORY — PX: RADIOLOGY WITH ANESTHESIA: SHX6223

## 2022-05-09 SURGERY — MRI WITH ANESTHESIA
Anesthesia: General | Laterality: Right

## 2022-05-09 MED ORDER — DEXAMETHASONE SODIUM PHOSPHATE 10 MG/ML IJ SOLN
INTRAMUSCULAR | Status: DC | PRN
  Administered 2022-05-09: 10 mg via INTRAVENOUS

## 2022-05-09 MED ORDER — ORAL CARE MOUTH RINSE: 15.0000 mL | Freq: Once | OROMUCOSAL | Status: AC

## 2022-05-09 MED ORDER — LIDOCAINE 2% (20 MG/ML) 5 ML SYRINGE
INTRAMUSCULAR | Status: DC | PRN
  Administered 2022-05-09: 100 mg via INTRAVENOUS

## 2022-05-09 MED ORDER — MIDAZOLAM HCL 2 MG/2ML IJ SOLN
INTRAMUSCULAR | Status: DC | PRN
  Administered 2022-05-09: 2 mg via INTRAVENOUS

## 2022-05-09 MED ORDER — PROPOFOL 10 MG/ML IV BOLUS
INTRAVENOUS | Status: DC | PRN
  Administered 2022-05-09: 170 mg via INTRAVENOUS

## 2022-05-09 MED ORDER — LACTATED RINGERS IV SOLN: INTRAVENOUS | Status: DC

## 2022-05-09 MED ORDER — CHLORHEXIDINE GLUCONATE 0.12 % MT SOLN: 15.0000 mL | Freq: Once | OROMUCOSAL | Status: AC

## 2022-05-09 MED ORDER — ONDANSETRON HCL 4 MG/2ML IJ SOLN
INTRAMUSCULAR | Status: DC | PRN
  Administered 2022-05-09: 4 mg via INTRAVENOUS

## 2022-05-09 MED ORDER — CHLORHEXIDINE GLUCONATE 0.12 % MT SOLN
OROMUCOSAL | Status: AC
  Administered 2022-05-09: 15 mL via OROMUCOSAL
  Filled 2022-05-09: qty 15

## 2022-05-09 MED ORDER — PHENYLEPHRINE 80 MCG/ML (10ML) SYRINGE FOR IV PUSH (FOR BLOOD PRESSURE SUPPORT)
PREFILLED_SYRINGE | INTRAVENOUS | Status: DC | PRN
  Administered 2022-05-09: 160 ug via INTRAVENOUS
  Administered 2022-05-09: 80 ug via INTRAVENOUS
  Administered 2022-05-09: 160 ug via INTRAVENOUS

## 2022-05-09 MED ORDER — DEXMEDETOMIDINE HCL IN NACL 80 MCG/20ML IV SOLN
INTRAVENOUS | Status: DC | PRN
  Administered 2022-05-09 (×2): 8 ug via BUCCAL

## 2022-05-09 NOTE — Anesthesia Postprocedure Evaluation (Signed)
Anesthesia Post Note  Patient: Bruce Terry  Procedure(s) Performed: MRI WITH ANESTHESIA RIGHT SHOULDER (Right)     Patient location during evaluation: PACU Anesthesia Type: General Level of consciousness: awake and alert Pain management: pain level controlled Vital Signs Assessment: post-procedure vital signs reviewed and stable Respiratory status: spontaneous breathing, nonlabored ventilation and respiratory function stable Cardiovascular status: blood pressure returned to baseline and stable Postop Assessment: no apparent nausea or vomiting Anesthetic complications: no   No notable events documented.  Last Vitals:  Vitals:   05/09/22 0951 05/09/22 1000  BP:  123/75  Pulse: 89 89  Resp: 13 13  Temp:  36.7 C  SpO2: 95% 93%    Last Pain:  Vitals:   05/09/22 0656  TempSrc:   PainSc: [place]

## 2022-05-09 NOTE — Anesthesia Procedure Notes (Addendum)
Procedure Name: LMA Insertion Date/Time: 05/09/2022 8:27 AM  Performed by: Lorie Phenix, CRNAPre-anesthesia Checklist: Patient identified, Emergency Drugs available, Suction available and Patient being monitored Patient Re-evaluated:Patient Re-evaluated prior to induction Oxygen Delivery Method: Circle System Utilized Preoxygenation: Pre-oxygenation with 100% oxygen Induction Type: IV induction Ventilation: Mask ventilation without difficulty LMA: LMA with gastric port inserted LMA Size: 5.0 Number of attempts: 1 Placement Confirmation: positive ETCO2 Tube secured with: Tape Dental Injury: Teeth and Oropharynx as per pre-operative assessment

## 2022-05-09 NOTE — Transfer of Care (Signed)
Immediate Anesthesia Transfer of Care Note  Patient: Bruce Terry  Procedure(s) Performed: MRI WITH ANESTHESIA RIGHT SHOULDER (Right)  Patient Location: PACU  Anesthesia Type:General  Level of Consciousness: awake and alert   Airway & Oxygen Therapy: Patient Spontanous Breathing  Post-op Assessment: Report given to RN and Post -op Vital signs reviewed and stable  Post vital signs: Reviewed and stable  Last Vitals:  Vitals Value Taken Time  BP 138/81 05/09/22 0924  Temp 36.7 C 05/09/22 0924  Pulse 93 05/09/22 0928  Resp 14 05/09/22 0928  SpO2 90 % 05/09/22 0928  Vitals shown include unvalidated device data.  Last Pain:  Vitals:   05/09/22 0656  TempSrc:   PainSc: 5       Patients Stated Pain Goal: 3 (15/18/34 3735)  Complications: No notable events documented.

## 2022-05-09 NOTE — H&P (Signed)
Anesthesia H&P Update: History and Physical Exam reviewed; patient is OK for planned anesthetic and procedure. ? ?

## 2022-05-10 ENCOUNTER — Encounter (HOSPITAL_COMMUNITY): Payer: Self-pay | Admitting: Radiology

## 2022-05-17 NOTE — H&P (Signed)
Bruce Terry MRN:  [id number] DOB/SEX:  1971-02-26/male  CHIEF COMPLAINT:  H/P for MRI with anesthesia  HISTORY: Patient is a 52 y.o. male presented with a history of pain in the right shoulder. Onset of symptoms was gradual starting a few months ago with gradually worsening course since that time. Patient has been treated conservatively with over-the-counter NSAIDs and activity modification.   PAST MEDICAL HISTORY: Patient Active Problem List   Diagnosis Date Noted   Actinic keratosis 09/08/2017   BACK PAIN, LUMBAR 07/10/2007   Hyperlipidemia 02/24/2007   Essential hypertension 02/24/2007   Past Medical History:  Diagnosis Date   GERD (gastroesophageal reflux disease)    was caused by another medication   Hemorrhoid    Hyperlipidemia    Hypertension    Low back pain    Past Surgical History:  Procedure Laterality Date   BACK SURGERY     x 5   COLONOSCOPY     ELBOW SURGERY Right    EYE SURGERY Bilateral 2022   cataracts   RADIOLOGY WITH ANESTHESIA N/A 02/08/2021   Procedure: MRI WITH ANESTHESIA WITH/WITHOUT CONTRAST;  Surgeon: Radiologist, Medication, MD;  Location: [facility];  Service: Radiology;  Laterality: N/A;   RADIOLOGY WITH ANESTHESIA Right 05/09/2022   Procedure: MRI WITH ANESTHESIA RIGHT SHOULDER;  Surgeon: Radiologist, Medication, MD;  Location: [facility] [place];  Service: Radiology;  Laterality: Right;   right hand surgery Right    SHOULDER SURGERY Right    SPINAL CORD STIMULATOR IMPLANT  02/07/2021   at the [facility] - has remote for stimulator     MEDICATIONS:   No medications prior to admission.    ALLERGIES:   Allergies  Allergen Reactions   Amitriptyline Nausea And Vomiting   Morphine And Related     GI upset    REVIEW OF SYSTEMS:  A comprehensive review of systems was negative except for: Musculoskeletal: positive for arthralgias, bone pain, and muscle weakness   FAMILY HISTORY:   Family History  Problem Relation Age of Onset    Hypertension Mother    Cirrhosis Mother    Diabetes Mother    Hypothyroidism Mother    Obesity Mother    Kidney disease Mother    Alcohol abuse Father    Colon cancer Neg Hx    Esophageal cancer Neg Hx    Rectal cancer Neg Hx    Stomach cancer Neg Hx     SOCIAL HISTORY:   Social History   Tobacco Use   Smoking status: Never   Smokeless tobacco: Never  Substance Use Topics   Alcohol use: Yes    Alcohol/week: 4.0 standard drinks of alcohol    Types: 4 Cans of beer per week    Comment: occasional     EXAMINATION:  Vital signs in last 24 hours:    BP 123/75   Pulse 89   Temp 98 F (36.7 C)   Resp 13   Ht '5\' 11"'$  (1.803 m)   Wt 128.8 kg   SpO2 93%   BMI 39.61 kg/m   General Appearance:    Alert, cooperative, no distress, appears stated age  Head:    Normocephalic, without obvious abnormality, atraumatic  Eyes:    PERRL, conjunctiva/corneas clear, EOM's intact, fundi    benign, both eyes       Ears:    Normal TM's and external ear canals, both ears  Nose:   Nares normal, septum midline, mucosa normal, no drainage    or sinus  tenderness  Throat:   Lips, mucosa, and tongue normal; teeth and gums normal  Neck:   Supple, symmetrical, trachea midline, no adenopathy;       thyroid:  No enlargement/tenderness/nodules; no carotid   bruit or JVD  Back:     Symmetric, no curvature, ROM normal, no CVA tenderness  Lungs:     Clear to auscultation bilaterally, respirations unlabored  Chest wall:    No tenderness or deformity     Abdomen:     Soft, non-tender, bowel sounds active all four quadrants,    no masses, no organomegaly        Extremities:   Extremities normal, atraumatic, no cyanosis or edema  Pulses:   2+ and symmetric all extremities  Skin:   Skin color, texture, turgor normal, no rashes or lesions  Lymph nodes:   Cervical, supraclavicular, and axillary nodes normal  Neurologic:   CNII-XII intact. Normal strength, sensation and reflexes      throughout     Musculoskeletal:  right shoulder pain/tenderness, decreased ROM Imaging Review Plain radiographs demonstrate mild degenerative joint disease of the right shoulder   Assessment/Plan: Chronic right shoulder pain, persistent despite conservative treatment. Plan for MRI to look at rotator cuff pathology    Bruce Terry,STEPHEN D 05/17/2022, 7:05 AM

## 2022-05-20 DIAGNOSIS — M75121 Complete rotator cuff tear or rupture of right shoulder, not specified as traumatic: Secondary | ICD-10-CM | POA: Diagnosis not present

## 2022-06-04 DIAGNOSIS — M25511 Pain in right shoulder: Secondary | ICD-10-CM | POA: Diagnosis not present

## 2022-06-04 DIAGNOSIS — M25611 Stiffness of right shoulder, not elsewhere classified: Secondary | ICD-10-CM | POA: Diagnosis not present

## 2022-06-04 DIAGNOSIS — R29898 Other symptoms and signs involving the musculoskeletal system: Secondary | ICD-10-CM | POA: Diagnosis not present

## 2022-06-04 DIAGNOSIS — M75121 Complete rotator cuff tear or rupture of right shoulder, not specified as traumatic: Secondary | ICD-10-CM | POA: Diagnosis not present

## 2022-06-05 DIAGNOSIS — G894 Chronic pain syndrome: Secondary | ICD-10-CM | POA: Diagnosis not present

## 2022-06-05 DIAGNOSIS — Z6841 Body Mass Index (BMI) 40.0 and over, adult: Secondary | ICD-10-CM | POA: Diagnosis not present

## 2022-06-05 DIAGNOSIS — F1120 Opioid dependence, uncomplicated: Secondary | ICD-10-CM | POA: Diagnosis not present

## 2022-06-05 DIAGNOSIS — Z9689 Presence of other specified functional implants: Secondary | ICD-10-CM | POA: Diagnosis not present

## 2022-06-12 DIAGNOSIS — M75121 Complete rotator cuff tear or rupture of right shoulder, not specified as traumatic: Secondary | ICD-10-CM | POA: Diagnosis not present

## 2022-06-12 DIAGNOSIS — M25511 Pain in right shoulder: Secondary | ICD-10-CM | POA: Diagnosis not present

## 2022-06-12 DIAGNOSIS — R29898 Other symptoms and signs involving the musculoskeletal system: Secondary | ICD-10-CM | POA: Diagnosis not present

## 2022-06-12 DIAGNOSIS — M25611 Stiffness of right shoulder, not elsewhere classified: Secondary | ICD-10-CM | POA: Diagnosis not present

## 2022-06-14 DIAGNOSIS — M25611 Stiffness of right shoulder, not elsewhere classified: Secondary | ICD-10-CM | POA: Diagnosis not present

## 2022-06-14 DIAGNOSIS — M75121 Complete rotator cuff tear or rupture of right shoulder, not specified as traumatic: Secondary | ICD-10-CM | POA: Diagnosis not present

## 2022-06-14 DIAGNOSIS — R29898 Other symptoms and signs involving the musculoskeletal system: Secondary | ICD-10-CM | POA: Diagnosis not present

## 2022-06-14 DIAGNOSIS — M25511 Pain in right shoulder: Secondary | ICD-10-CM | POA: Diagnosis not present

## 2022-06-19 DIAGNOSIS — R29898 Other symptoms and signs involving the musculoskeletal system: Secondary | ICD-10-CM | POA: Diagnosis not present

## 2022-06-19 DIAGNOSIS — M25611 Stiffness of right shoulder, not elsewhere classified: Secondary | ICD-10-CM | POA: Diagnosis not present

## 2022-06-19 DIAGNOSIS — M25511 Pain in right shoulder: Secondary | ICD-10-CM | POA: Diagnosis not present

## 2022-06-19 DIAGNOSIS — M75121 Complete rotator cuff tear or rupture of right shoulder, not specified as traumatic: Secondary | ICD-10-CM | POA: Diagnosis not present

## 2022-06-26 DIAGNOSIS — R29898 Other symptoms and signs involving the musculoskeletal system: Secondary | ICD-10-CM | POA: Diagnosis not present

## 2022-06-26 DIAGNOSIS — M25511 Pain in right shoulder: Secondary | ICD-10-CM | POA: Diagnosis not present

## 2022-06-26 DIAGNOSIS — M25611 Stiffness of right shoulder, not elsewhere classified: Secondary | ICD-10-CM | POA: Diagnosis not present

## 2022-06-26 DIAGNOSIS — M75121 Complete rotator cuff tear or rupture of right shoulder, not specified as traumatic: Secondary | ICD-10-CM | POA: Diagnosis not present

## 2022-06-27 DIAGNOSIS — E291 Testicular hypofunction: Secondary | ICD-10-CM | POA: Diagnosis not present

## 2022-07-01 DIAGNOSIS — M75121 Complete rotator cuff tear or rupture of right shoulder, not specified as traumatic: Secondary | ICD-10-CM | POA: Diagnosis not present

## 2022-07-04 DIAGNOSIS — E291 Testicular hypofunction: Secondary | ICD-10-CM | POA: Diagnosis not present

## 2022-07-19 DIAGNOSIS — T85192A Other mechanical complication of implanted electronic neurostimulator (electrode) of spinal cord, initial encounter: Secondary | ICD-10-CM | POA: Diagnosis not present

## 2022-07-19 DIAGNOSIS — T859XXA Unspecified complication of internal prosthetic device, implant and graft, initial encounter: Secondary | ICD-10-CM | POA: Diagnosis not present

## 2022-07-19 DIAGNOSIS — Z4542 Encounter for adjustment and management of neuropacemaker (brain) (peripheral nerve) (spinal cord): Secondary | ICD-10-CM | POA: Diagnosis not present

## 2022-08-01 ENCOUNTER — Other Ambulatory Visit: Payer: Self-pay

## 2022-08-01 ENCOUNTER — Telehealth: Payer: Self-pay | Admitting: Adult Health

## 2022-08-01 MED ORDER — WEGOVY 0.25 MG/0.5ML ~~LOC~~ SOAJ: 0.2500 mg | SUBCUTANEOUS | 0 refills | Status: DC

## 2022-08-01 NOTE — Telephone Encounter (Signed)
Pt is needing refill for [facility], [facility] [address] [facility] [place].

## 2022-08-01 NOTE — Telephone Encounter (Signed)
Rx refilled.

## 2022-08-04 ENCOUNTER — Other Ambulatory Visit: Payer: Self-pay | Admitting: Adult Health

## 2022-08-04 DIAGNOSIS — E785 Hyperlipidemia, unspecified: Secondary | ICD-10-CM

## 2022-08-04 DIAGNOSIS — I10 Essential (primary) hypertension: Secondary | ICD-10-CM

## 2022-08-05 ENCOUNTER — Telehealth: Payer: Self-pay

## 2022-08-05 NOTE — Telephone Encounter (Signed)
Pt is calling and per pt he needs new rx for wegovy 0.5  [id number] [place], [place] - [address] Phone: [phone]  Fax: [phone]

## 2022-08-05 NOTE — Telephone Encounter (Signed)
PA is need for wegovy 0.5 [facility] started PA for wegovy through covermymed key [id number]

## 2022-08-07 NOTE — Telephone Encounter (Signed)
Pt notified that we are waiting on the PA to get approved. Pt informed that this can take 24-72 hrs. Pt verbalized understanding.

## 2022-08-07 NOTE — Telephone Encounter (Signed)
Pt is going out of town and [facility]govy 0.5 in stock. Pt is aware waiting on provider

## 2022-08-07 NOTE — Telephone Encounter (Signed)
Bruce Terry (Key: [id number]) Rx #: [id number] [phone] 0.25MG /0.5ML auto-injectors Form [facility] Electronic Prior Authorization Form (2017 NCPDP) Created 5 days ago Sent to Plan 1 hour ago Plan Response 1 hour ago Submit Clinical Questions 4 minutes ago Determination Wait for Determination Please wait for [facility] 2017 NCPDP to return a determination.

## 2022-08-08 ENCOUNTER — Other Ambulatory Visit: Payer: Self-pay

## 2022-08-08 MED ORDER — WEGOVY 0.5 MG/0.5ML ~~LOC~~ SOAJ: 0.5000 mg | SUBCUTANEOUS | 0 refills | Status: DC

## 2022-08-08 NOTE — Telephone Encounter (Signed)
Looks like wrong dosage was sent to pharmacy. Will send in the 0.5 dose and start a PA. Pt notified of update.

## 2022-08-08 NOTE — Telephone Encounter (Signed)
PA denied. Will call pt with update and start an appeal.

## 2022-08-13 DIAGNOSIS — F1120 Opioid dependence, uncomplicated: Secondary | ICD-10-CM | POA: Diagnosis not present

## 2022-08-13 DIAGNOSIS — G894 Chronic pain syndrome: Secondary | ICD-10-CM | POA: Diagnosis not present

## 2022-08-13 DIAGNOSIS — Z6841 Body Mass Index (BMI) 40.0 and over, adult: Secondary | ICD-10-CM | POA: Diagnosis not present

## 2022-08-14 NOTE — Telephone Encounter (Signed)
Pt notified that Rx is approved. Pt verbalized understating.

## 2022-08-14 NOTE — Telephone Encounter (Signed)
Timber Pesicka (KeyNunzio Cory) Rx #: [id number] [phone] 0.5MG /0.5ML auto-injectors Form [facility] Electronic Prior Authorization Form (2017 NCPDP) Created 6 days ago Sent to Plan 15 hours ago Plan Response 15 hours ago Submit Clinical Questions 15 hours ago Determination Favorable 14 hours ago Your prior authorization for Mancel Parsons has been approved! MORE INFO Personalized support and financial assistance may be available through the [employer] program. For more information, and to see program requirements, click on the More Info button to the right.  Message from plan: Request Reference Number: [id number]. WEGOVY INJ 0.5MG  is approved through 02/12/2023. Your patient may now fill this prescription and it will be covered.. Authorization Expiration Date: February 12, 2023.

## 2022-08-23 DIAGNOSIS — M75121 Complete rotator cuff tear or rupture of right shoulder, not specified as traumatic: Secondary | ICD-10-CM | POA: Diagnosis not present

## 2022-09-04 ENCOUNTER — Telehealth: Payer: Self-pay | Admitting: Adult Health

## 2022-09-04 MED ORDER — WEGOVY 0.5 MG/0.5ML ~~LOC~~ SOAJ: 0.5000 mg | SUBCUTANEOUS | 0 refills | Status: DC

## 2022-09-04 NOTE — Telephone Encounter (Signed)
Rx refilled.

## 2022-09-04 NOTE — Telephone Encounter (Signed)
Prescription Request  09/04/2022  LOV: 04/25/2022  What is the name of the medication or equipment? Wegovy.  Have you contacted your pharmacy to request a refill? Yes   Which pharmacy would you like this sent to? [id number] [place], [place] - [address] [place] [address] Phone: [phone] Fax: [phone]   Patient notified that their request is being sent to the clinical staff for review and that they should receive a response within 2 business days.   Please advise at Mobile [phone] (mobile)

## 2022-09-09 DIAGNOSIS — M75121 Complete rotator cuff tear or rupture of right shoulder, not specified as traumatic: Secondary | ICD-10-CM | POA: Diagnosis not present

## 2022-09-26 DIAGNOSIS — E291 Testicular hypofunction: Secondary | ICD-10-CM | POA: Diagnosis not present

## 2022-09-30 ENCOUNTER — Other Ambulatory Visit (HOSPITAL_COMMUNITY): Payer: Self-pay

## 2022-10-03 DIAGNOSIS — E291 Testicular hypofunction: Secondary | ICD-10-CM | POA: Diagnosis not present

## 2022-10-04 DIAGNOSIS — M75121 Complete rotator cuff tear or rupture of right shoulder, not specified as traumatic: Secondary | ICD-10-CM | POA: Diagnosis not present

## 2022-10-15 ENCOUNTER — Other Ambulatory Visit: Payer: Self-pay | Admitting: Family Medicine

## 2022-10-17 NOTE — Telephone Encounter (Signed)
Pt has started Rx and will be seen tomorrow.

## 2022-10-17 NOTE — Telephone Encounter (Signed)
Okay for refill?  

## 2022-10-18 ENCOUNTER — Ambulatory Visit (INDEPENDENT_AMBULATORY_CARE_PROVIDER_SITE_OTHER): Payer: BC Managed Care – PPO | Admitting: Adult Health

## 2022-10-18 ENCOUNTER — Encounter: Payer: Self-pay | Admitting: Adult Health

## 2022-10-18 DIAGNOSIS — Z6841 Body Mass Index (BMI) 40.0 and over, adult: Secondary | ICD-10-CM | POA: Diagnosis not present

## 2022-10-18 MED ORDER — SEMAGLUTIDE-WEIGHT MANAGEMENT 1 MG/0.5ML ~~LOC~~ SOAJ
1.0000 mg | SUBCUTANEOUS | 0 refills | Status: DC
Start: 2022-10-18 — End: 2022-11-26

## 2022-10-18 NOTE — Progress Notes (Signed)
Subjective:    Patient ID: Bruce Terry, male    DOB: 1970-12-14, 52 y.o.   MRN: [id number]  HPI 52 year old male who  has a past medical history of GERD (gastroesophageal reflux disease), Hemorrhoid, Hyperlipidemia, Hypertension, and Low back pain.  He presents to the office today for follow up regarding obesity and weight loss management. He has been using Wegovy 0.5 mg over the last month. He has been tolerating this medication well. Has noticed that it decreased his appetite.  He has had to stop the medication for the last three weeks until he has a right shoulder arthroscopy/rotator cuff repair next week.   Wt Readings from Last 3 Encounters:  10/18/22 294 lb (133.4 kg)  05/09/22 284 lb (128.8 kg)  03/15/22 296 lb (134.3 kg)    Review of Systems See HPI   Past Medical History:  Diagnosis Date   GERD (gastroesophageal reflux disease)    was caused by another medication   Hemorrhoid    Hyperlipidemia    Hypertension    Low back pain     Social History   Socioeconomic History   Marital status: Married    Spouse name: Not on file   Number of children: Not on file   Years of education: Not on file   Highest education level: Not on file  Occupational History   Not on file  Tobacco Use   Smoking status: Never   Smokeless tobacco: Never  Vaping Use   Vaping Use: Never used  Substance and Sexual Activity   Alcohol use: Yes    Alcohol/week: 4.0 standard drinks of alcohol    Types: 4 Cans of beer per week    Comment: occasional   Drug use: No   Sexual activity: Not on file  Other Topics Concern   Not on file  Social History Narrative   Not on file   Social Determinants of Health   Financial Resource Strain: Not on file  Food Insecurity: Not on file  Transportation Needs: Not on file  Physical Activity: Not on file  Stress: Not on file  Social Connections: Not on file  Intimate Partner Violence: Not on file    Past Surgical History:  Procedure  Laterality Date   BACK SURGERY     x 5   COLONOSCOPY     ELBOW SURGERY Right    EYE SURGERY Bilateral 2022   cataracts   RADIOLOGY WITH ANESTHESIA N/A 02/08/2021   Procedure: MRI WITH ANESTHESIA WITH/WITHOUT CONTRAST;  Surgeon: Radiologist, Medication, MD;  Location: [facility];  Service: Radiology;  Laterality: N/A;   RADIOLOGY WITH ANESTHESIA Right 05/09/2022   Procedure: MRI WITH ANESTHESIA RIGHT SHOULDER;  Surgeon: Radiologist, Medication, MD;  Location: [facility];  Service: Radiology;  Laterality: Right;   right hand surgery Right    SHOULDER SURGERY Right    SPINAL CORD STIMULATOR IMPLANT  02/07/2021   at [facility] - has remote for stimulator    Family History  Problem Relation Age of Onset   Hypertension Mother    Cirrhosis Mother    Diabetes Mother    Hypothyroidism Mother    Obesity Mother    Kidney disease Mother    Alcohol abuse Father    Colon cancer Neg Hx    Esophageal cancer Neg Hx    Rectal cancer Neg Hx    Stomach cancer Neg Hx     Allergies  Allergen Reactions   Amitriptyline Nausea And Vomiting  Morphine And Codeine     GI upset    Current Outpatient Medications on File Prior to Visit  Medication Sig Dispense Refill   anastrozole (ARIMIDEX) 1 MG tablet Take 1 mg by mouth 2 (two) times a week. Mondays and Thursdays     atorvastatin (LIPITOR) 80 MG tablet TAKE 1 TABLET BY MOUTH EVERY DAY 90 tablet 0   cyclobenzaprine (FLEXERIL) 10 MG tablet Take 10 mg by mouth at bedtime as needed for muscle spasms.     lisinopril (ZESTRIL) 20 MG tablet TAKE 1 TABLET BY MOUTH EVERYDAY AT BEDTIME 90 tablet 0   Melatonin 10 MG CAPS Take 10 mg by mouth at bedtime as needed (sleep).     Multiple Vitamin (MULTIVITAMIN WITH MINERALS) TABS tablet Take 1 tablet by mouth daily.     omeprazole (PRILOSEC) 20 MG capsule Take 1 capsule (20 mg total) by mouth daily. 30 capsule 3   oxyCODONE ER (XTAMPZA ER) 13.5 MG C12A Take 13.5 mg by mouth 2 (two) times daily as  needed (severe pain).     Oxycodone HCl 10 MG TABS Take 10 mg by mouth every 6 (six) hours as needed (pain).     Semaglutide-Weight Management (WEGOVY) 0.5 MG/0.5ML SOAJ Inject 0.5 mg into the skin once a week. 2 mL 0   testosterone cypionate (DEPOTESTOSTERONE CYPIONATE) 200 MG/ML injection Inject 150 mg into the skin every Sunday.     No current facility-administered medications on file prior to visit.    BP (!) 148/80   Pulse 90   Temp 98.3 F (36.8 C) (Oral)   Ht 5\' 11"  (1.803 m)   Wt 294 lb (133.4 kg)   SpO2 97%   BMI 41.00 kg/m       Objective:   Physical Exam Vitals and nursing note reviewed.  Constitutional:      Appearance: Normal appearance.  Cardiovascular:     Rate and Rhythm: Normal rate and regular rhythm.     Pulses: Normal pulses.     Heart sounds: Normal heart sounds.  Pulmonary:     Effort: Pulmonary effort is normal.     Breath sounds: Normal breath sounds.  Skin:    General: Skin is warm and dry.  Neurological:     General: No focal deficit present.     Mental Status: He is alert and oriented to person, place, and time.  Psychiatric:        Mood and Affect: Mood normal.        Behavior: Behavior normal.        Thought Content: Thought content normal.        Judgment: Judgment normal.        Assessment & Plan:  1. Morbid obesity with BMI of 40.0-44.9, adult (HCC) - Can start 1 mg dose after surgery  - Follow up in one month or sooner if needed - Semaglutide-Weight Management 1 MG/0.5ML SOAJ; Inject 1 mg into the skin once a week.  Dispense: 2 mL; Refill: 0  Shirline Frees, NP

## 2022-10-24 DIAGNOSIS — M7581 Other shoulder lesions, right shoulder: Secondary | ICD-10-CM | POA: Diagnosis not present

## 2022-10-24 DIAGNOSIS — M25811 Other specified joint disorders, right shoulder: Secondary | ICD-10-CM | POA: Diagnosis not present

## 2022-10-24 DIAGNOSIS — M75121 Complete rotator cuff tear or rupture of right shoulder, not specified as traumatic: Secondary | ICD-10-CM | POA: Diagnosis not present

## 2022-10-24 DIAGNOSIS — G8918 Other acute postprocedural pain: Secondary | ICD-10-CM | POA: Diagnosis not present

## 2022-10-24 DIAGNOSIS — M7521 Bicipital tendinitis, right shoulder: Secondary | ICD-10-CM | POA: Diagnosis not present

## 2022-10-31 ENCOUNTER — Other Ambulatory Visit: Payer: Self-pay | Admitting: Adult Health

## 2022-10-31 DIAGNOSIS — I10 Essential (primary) hypertension: Secondary | ICD-10-CM

## 2022-10-31 DIAGNOSIS — E785 Hyperlipidemia, unspecified: Secondary | ICD-10-CM

## 2022-11-04 DIAGNOSIS — M75121 Complete rotator cuff tear or rupture of right shoulder, not specified as traumatic: Secondary | ICD-10-CM | POA: Diagnosis not present

## 2022-11-12 DIAGNOSIS — F1120 Opioid dependence, uncomplicated: Secondary | ICD-10-CM | POA: Diagnosis not present

## 2022-11-12 DIAGNOSIS — G894 Chronic pain syndrome: Secondary | ICD-10-CM | POA: Diagnosis not present

## 2022-11-12 DIAGNOSIS — G90522 Complex regional pain syndrome I of left lower limb: Secondary | ICD-10-CM | POA: Diagnosis not present

## 2022-11-12 DIAGNOSIS — Z6841 Body Mass Index (BMI) 40.0 and over, adult: Secondary | ICD-10-CM | POA: Diagnosis not present

## 2022-11-13 DIAGNOSIS — Z4789 Encounter for other orthopedic aftercare: Secondary | ICD-10-CM | POA: Diagnosis not present

## 2022-11-13 DIAGNOSIS — M25511 Pain in right shoulder: Secondary | ICD-10-CM | POA: Diagnosis not present

## 2022-11-18 DIAGNOSIS — Z4789 Encounter for other orthopedic aftercare: Secondary | ICD-10-CM | POA: Diagnosis not present

## 2022-11-18 DIAGNOSIS — M25511 Pain in right shoulder: Secondary | ICD-10-CM | POA: Diagnosis not present

## 2022-11-19 ENCOUNTER — Ambulatory Visit: Payer: BC Managed Care – PPO | Admitting: Adult Health

## 2022-11-20 DIAGNOSIS — M25511 Pain in right shoulder: Secondary | ICD-10-CM | POA: Diagnosis not present

## 2022-11-20 DIAGNOSIS — Z4789 Encounter for other orthopedic aftercare: Secondary | ICD-10-CM | POA: Diagnosis not present

## 2022-11-25 DIAGNOSIS — Z4789 Encounter for other orthopedic aftercare: Secondary | ICD-10-CM | POA: Diagnosis not present

## 2022-11-25 DIAGNOSIS — M25511 Pain in right shoulder: Secondary | ICD-10-CM | POA: Diagnosis not present

## 2022-11-26 ENCOUNTER — Ambulatory Visit (INDEPENDENT_AMBULATORY_CARE_PROVIDER_SITE_OTHER): Payer: BC Managed Care – PPO | Admitting: Adult Health

## 2022-11-26 ENCOUNTER — Encounter: Payer: Self-pay | Admitting: Adult Health

## 2022-11-26 VITALS — BP 128/88 | HR 90 | Temp 98.6°F | Ht 71.0 in | Wt 285.0 lb

## 2022-11-26 DIAGNOSIS — Z6841 Body Mass Index (BMI) 40.0 and over, adult: Secondary | ICD-10-CM | POA: Diagnosis not present

## 2022-11-26 MED ORDER — WEGOVY 1.7 MG/0.75ML ~~LOC~~ SOAJ
1.7000 mg | SUBCUTANEOUS | 0 refills | Status: AC
Start: 2022-11-26 — End: 2023-02-24

## 2022-11-26 NOTE — Progress Notes (Signed)
Subjective:    Patient ID: Bruce Terry, male    DOB: 1970-09-26, 52 y.o.   MRN: [id number]  HPI 52 year old male who  has a past medical history of GERD (gastroesophageal reflux disease), Hemorrhoid, Hyperlipidemia, Hypertension, and Low back pain.  He presents to the office today for follow-up regarding obesity and weight loss management.  Been using Wegovy 1 mg over the last month.  He has been tolerating this medication well.  He has noticed that has decreased his appetite. He is eating healthier. He has not been able to do much physical activity due to recent right shoulder surgery.   Wt Readings from Last 3 Encounters:  11/26/22 285 lb (129.3 kg)  10/18/22 294 lb (133.4 kg)  05/09/22 284 lb (128.8 kg)      Review of Systems See HPI   Past Medical History:  Diagnosis Date   GERD (gastroesophageal reflux disease)    was caused by another medication   Hemorrhoid    Hyperlipidemia    Hypertension    Low back pain     Social History   Socioeconomic History   Marital status: Married    Spouse name: Not on file   Number of children: Not on file   Years of education: Not on file   Highest education level: Not on file  Occupational History   Not on file  Tobacco Use   Smoking status: Never   Smokeless tobacco: Never  Vaping Use   Vaping status: Never Used  Substance and Sexual Activity   Alcohol use: Yes    Alcohol/week: 4.0 standard drinks of alcohol    Types: 4 Cans of beer per week    Comment: occasional   Drug use: No   Sexual activity: Not on file  Other Topics Concern   Not on file  Social History Narrative   Not on file   Social Determinants of Health   Financial Resource Strain: Not on file  Food Insecurity: Not on file  Transportation Needs: Not on file  Physical Activity: Not on file  Stress: Not on file  Social Connections: Unknown (09/25/2021)   Received from [facility]   Social Network    Social Network: Not on file  Intimate  Partner Violence: Unknown (08/17/2021)   Received from [facility]   HITS    Physically Hurt: Not on file    Insult or Talk Down To: Not on file    Threaten Physical Harm: Not on file    Scream or Curse: Not on file    Past Surgical History:  Procedure Laterality Date   BACK SURGERY     x 5   COLONOSCOPY     ELBOW SURGERY Right    EYE SURGERY Bilateral 2022   cataracts   RADIOLOGY WITH ANESTHESIA N/A 02/08/2021   Procedure: MRI WITH ANESTHESIA WITH/WITHOUT CONTRAST;  Surgeon: Radiologist, Medication, MD;  Location: [facility];  Service: Radiology;  Laterality: N/A;   RADIOLOGY WITH ANESTHESIA Right 05/09/2022   Procedure: MRI WITH ANESTHESIA RIGHT SHOULDER;  Surgeon: Radiologist, Medication, MD;  Location: [facility];  Service: Radiology;  Laterality: Right;   right hand surgery Right    SHOULDER SURGERY Right    SPINAL CORD STIMULATOR IMPLANT  02/07/2021   at [facility] - has remote for stimulator    Family History  Problem Relation Age of Onset   Hypertension Mother    Cirrhosis Mother    Diabetes Mother  Hypothyroidism Mother    Obesity Mother    Kidney disease Mother    Alcohol abuse Father    Colon cancer Neg Hx    Esophageal cancer Neg Hx    Rectal cancer Neg Hx    Stomach cancer Neg Hx     Allergies  Allergen Reactions   Amitriptyline Nausea And Vomiting   Morphine And Codeine     GI upset    Current Outpatient Medications on File Prior to Visit  Medication Sig Dispense Refill   anastrozole (ARIMIDEX) 1 MG tablet Take 1 mg by mouth 2 (two) times a week. Mondays and Thursdays     atorvastatin (LIPITOR) 80 MG tablet TAKE 1 TABLET BY MOUTH EVERY DAY 90 tablet 0   cyclobenzaprine (FLEXERIL) 10 MG tablet Take 10 mg by mouth at bedtime as needed for muscle spasms.     lisinopril (ZESTRIL) 20 MG tablet TAKE 1 TABLET BY MOUTH EVERYDAY AT BEDTIME 90 tablet 0   Multiple Vitamin (MULTIVITAMIN WITH MINERALS) TABS tablet Take 1 tablet by mouth daily.      Oxycodone HCl 10 MG TABS Take 10 mg by mouth every 6 (six) hours as needed (pain).     Semaglutide-Weight Management 1 MG/0.5ML SOAJ Inject 1 mg into the skin once a week. 2 mL 0   testosterone cypionate (DEPOTESTOSTERONE CYPIONATE) 200 MG/ML injection Inject 150 mg into the skin every Sunday.     No current facility-administered medications on file prior to visit.    BP 128/88   Pulse 90   Temp 98.6 F (37 C) (Oral)   Ht 5\' 11"  (1.803 m)   Wt 285 lb (129.3 kg)   SpO2 95%   BMI 39.75 kg/m       Objective:   Physical Exam Vitals and nursing note reviewed.  Constitutional:      Appearance: Normal appearance. He is obese.  Cardiovascular:     Rate and Rhythm: Normal rate and regular rhythm.     Pulses: Normal pulses.     Heart sounds: Normal heart sounds.  Pulmonary:     Effort: Pulmonary effort is normal.     Breath sounds: Normal breath sounds.  Musculoskeletal:        General: Normal range of motion.     Comments: Right arm in sling   Skin:    General: Skin is warm and dry.  Neurological:     General: No focal deficit present.     Mental Status: He is alert and oriented to person, place, and time.  Psychiatric:        Mood and Affect: Mood normal.        Behavior: Behavior normal.        Thought Content: Thought content normal.        Judgment: Judgment normal.       Assessment & Plan:  1. Morbid obesity with BMI of 40.0-44.9, adult (HCC) - Will increase to 1.7 mg weekly.  - Have him follow up in 3 months for his CPE and reevaluation.  - Semaglutide-Weight Management (WEGOVY) 1.7 MG/0.75ML SOAJ; Inject 1.7 mg into the skin once a week.  Dispense: 9 mL; Refill: 0  Shirline Frees, NP

## 2022-12-02 DIAGNOSIS — M75121 Complete rotator cuff tear or rupture of right shoulder, not specified as traumatic: Secondary | ICD-10-CM | POA: Diagnosis not present

## 2022-12-04 DIAGNOSIS — Z4789 Encounter for other orthopedic aftercare: Secondary | ICD-10-CM | POA: Diagnosis not present

## 2022-12-04 DIAGNOSIS — M25511 Pain in right shoulder: Secondary | ICD-10-CM | POA: Diagnosis not present

## 2022-12-09 DIAGNOSIS — Z4789 Encounter for other orthopedic aftercare: Secondary | ICD-10-CM | POA: Diagnosis not present

## 2022-12-09 DIAGNOSIS — M25511 Pain in right shoulder: Secondary | ICD-10-CM | POA: Diagnosis not present

## 2022-12-11 DIAGNOSIS — Z4789 Encounter for other orthopedic aftercare: Secondary | ICD-10-CM | POA: Diagnosis not present

## 2022-12-11 DIAGNOSIS — M25511 Pain in right shoulder: Secondary | ICD-10-CM | POA: Diagnosis not present

## 2022-12-16 DIAGNOSIS — Z4789 Encounter for other orthopedic aftercare: Secondary | ICD-10-CM | POA: Diagnosis not present

## 2022-12-16 DIAGNOSIS — M25511 Pain in right shoulder: Secondary | ICD-10-CM | POA: Diagnosis not present

## 2022-12-18 DIAGNOSIS — M25511 Pain in right shoulder: Secondary | ICD-10-CM | POA: Diagnosis not present

## 2022-12-18 DIAGNOSIS — Z4789 Encounter for other orthopedic aftercare: Secondary | ICD-10-CM | POA: Diagnosis not present

## 2022-12-25 DIAGNOSIS — M25511 Pain in right shoulder: Secondary | ICD-10-CM | POA: Diagnosis not present

## 2022-12-25 DIAGNOSIS — Z4789 Encounter for other orthopedic aftercare: Secondary | ICD-10-CM | POA: Diagnosis not present

## 2022-12-30 DIAGNOSIS — Z4789 Encounter for other orthopedic aftercare: Secondary | ICD-10-CM | POA: Diagnosis not present

## 2022-12-30 DIAGNOSIS — M25511 Pain in right shoulder: Secondary | ICD-10-CM | POA: Diagnosis not present

## 2023-01-08 DIAGNOSIS — M75121 Complete rotator cuff tear or rupture of right shoulder, not specified as traumatic: Secondary | ICD-10-CM | POA: Diagnosis not present

## 2023-01-15 DIAGNOSIS — M25511 Pain in right shoulder: Secondary | ICD-10-CM | POA: Diagnosis not present

## 2023-01-15 DIAGNOSIS — Z4789 Encounter for other orthopedic aftercare: Secondary | ICD-10-CM | POA: Diagnosis not present

## 2023-01-17 DIAGNOSIS — M25511 Pain in right shoulder: Secondary | ICD-10-CM | POA: Diagnosis not present

## 2023-01-17 DIAGNOSIS — Z4789 Encounter for other orthopedic aftercare: Secondary | ICD-10-CM | POA: Diagnosis not present

## 2023-01-20 DIAGNOSIS — Z4789 Encounter for other orthopedic aftercare: Secondary | ICD-10-CM | POA: Diagnosis not present

## 2023-01-20 DIAGNOSIS — M25511 Pain in right shoulder: Secondary | ICD-10-CM | POA: Diagnosis not present

## 2023-01-22 DIAGNOSIS — Z4789 Encounter for other orthopedic aftercare: Secondary | ICD-10-CM | POA: Diagnosis not present

## 2023-01-22 DIAGNOSIS — M25511 Pain in right shoulder: Secondary | ICD-10-CM | POA: Diagnosis not present

## 2023-01-27 DIAGNOSIS — Z4789 Encounter for other orthopedic aftercare: Secondary | ICD-10-CM | POA: Diagnosis not present

## 2023-01-27 DIAGNOSIS — M25511 Pain in right shoulder: Secondary | ICD-10-CM | POA: Diagnosis not present

## 2023-01-28 DIAGNOSIS — E291 Testicular hypofunction: Secondary | ICD-10-CM | POA: Diagnosis not present

## 2023-01-28 DIAGNOSIS — Z125 Encounter for screening for malignant neoplasm of prostate: Secondary | ICD-10-CM | POA: Diagnosis not present

## 2023-02-03 DIAGNOSIS — Z4789 Encounter for other orthopedic aftercare: Secondary | ICD-10-CM | POA: Diagnosis not present

## 2023-02-03 DIAGNOSIS — M25511 Pain in right shoulder: Secondary | ICD-10-CM | POA: Diagnosis not present

## 2023-02-04 DIAGNOSIS — M25511 Pain in right shoulder: Secondary | ICD-10-CM | POA: Diagnosis not present

## 2023-02-04 DIAGNOSIS — Z4789 Encounter for other orthopedic aftercare: Secondary | ICD-10-CM | POA: Diagnosis not present

## 2023-02-04 DIAGNOSIS — E291 Testicular hypofunction: Secondary | ICD-10-CM | POA: Diagnosis not present

## 2023-02-04 DIAGNOSIS — N5201 Erectile dysfunction due to arterial insufficiency: Secondary | ICD-10-CM | POA: Diagnosis not present

## 2023-02-05 ENCOUNTER — Encounter: Payer: BC Managed Care – PPO | Admitting: Adult Health

## 2023-02-05 DIAGNOSIS — M75121 Complete rotator cuff tear or rupture of right shoulder, not specified as traumatic: Secondary | ICD-10-CM | POA: Diagnosis not present

## 2023-02-05 DIAGNOSIS — F1120 Opioid dependence, uncomplicated: Secondary | ICD-10-CM | POA: Diagnosis not present

## 2023-02-05 DIAGNOSIS — G894 Chronic pain syndrome: Secondary | ICD-10-CM | POA: Diagnosis not present

## 2023-02-06 ENCOUNTER — Other Ambulatory Visit: Payer: Self-pay | Admitting: Adult Health

## 2023-02-06 DIAGNOSIS — I10 Essential (primary) hypertension: Secondary | ICD-10-CM

## 2023-02-06 DIAGNOSIS — E785 Hyperlipidemia, unspecified: Secondary | ICD-10-CM

## 2023-02-20 ENCOUNTER — Encounter: Payer: BC Managed Care – PPO | Admitting: Adult Health

## 2023-02-25 ENCOUNTER — Encounter (HOSPITAL_COMMUNITY): Payer: Self-pay | Admitting: Sports Medicine

## 2023-02-25 ENCOUNTER — Other Ambulatory Visit (HOSPITAL_COMMUNITY): Payer: Self-pay | Admitting: Sports Medicine

## 2023-02-25 DIAGNOSIS — M75121 Complete rotator cuff tear or rupture of right shoulder, not specified as traumatic: Secondary | ICD-10-CM | POA: Diagnosis not present

## 2023-03-06 ENCOUNTER — Ambulatory Visit (INDEPENDENT_AMBULATORY_CARE_PROVIDER_SITE_OTHER): Payer: BC Managed Care – PPO | Admitting: Adult Health

## 2023-03-06 ENCOUNTER — Encounter: Payer: Self-pay | Admitting: Adult Health

## 2023-03-06 VITALS — BP 122/84 | HR 99 | Temp 98.2°F | Ht 70.0 in | Wt 273.0 lb

## 2023-03-06 DIAGNOSIS — M545 Low back pain, unspecified: Secondary | ICD-10-CM | POA: Diagnosis not present

## 2023-03-06 DIAGNOSIS — Z0000 Encounter for general adult medical examination without abnormal findings: Secondary | ICD-10-CM | POA: Diagnosis not present

## 2023-03-06 DIAGNOSIS — E785 Hyperlipidemia, unspecified: Secondary | ICD-10-CM | POA: Diagnosis not present

## 2023-03-06 DIAGNOSIS — M25511 Pain in right shoulder: Secondary | ICD-10-CM

## 2023-03-06 DIAGNOSIS — I10 Essential (primary) hypertension: Secondary | ICD-10-CM | POA: Diagnosis not present

## 2023-03-06 DIAGNOSIS — Z125 Encounter for screening for malignant neoplasm of prostate: Secondary | ICD-10-CM

## 2023-03-06 DIAGNOSIS — R7989 Other specified abnormal findings of blood chemistry: Secondary | ICD-10-CM

## 2023-03-06 DIAGNOSIS — G8929 Other chronic pain: Secondary | ICD-10-CM

## 2023-03-06 DIAGNOSIS — Z6841 Body Mass Index (BMI) 40.0 and over, adult: Secondary | ICD-10-CM

## 2023-03-06 LAB — LIPID PANEL
Cholesterol: 106 mg/dL (ref 0–200)
HDL: 35.2 mg/dL — ABNORMAL LOW (ref >39.00)
LDL Cholesterol: 57 mg/dL (ref 0–99)
NonHDL: 70.44
Total CHOL/HDL Ratio: 3
Triglycerides: 65 mg/dL (ref 0.0–149.0)
VLDL: 13 mg/dL (ref 0.0–40.0)

## 2023-03-06 LAB — COMPREHENSIVE METABOLIC PANEL
ALT: 46 U/L (ref 0–53)
AST: 36 U/L (ref 0–37)
Albumin: 4.7 g/dL (ref 3.5–5.2)
Alkaline Phosphatase: 53 U/L (ref 39–117)
BUN: 14 mg/dL (ref 6–23)
CO2: 31 meq/L (ref 19–32)
Calcium: 9.4 mg/dL (ref 8.4–10.5)
Chloride: 100 meq/L (ref 96–112)
Creatinine, Ser: 1.03 mg/dL (ref 0.40–1.50)
GFR: 83.24 mL/min (ref >60.00)
Glucose, Bld: 89 mg/dL (ref 70–99)
Potassium: 4.5 meq/L (ref 3.5–5.1)
Sodium: 137 meq/L (ref 135–145)
Total Bilirubin: 1.6 mg/dL — ABNORMAL HIGH (ref 0.2–1.2)
Total Protein: 7.2 g/dL (ref 6.0–8.3)

## 2023-03-06 LAB — TSH: TSH: 1.73 u[IU]/mL (ref 0.35–5.50)

## 2023-03-06 LAB — PSA: PSA: 0.9 ng/mL (ref 0.10–4.00)

## 2023-03-06 MED ORDER — WEGOVY 1.7 MG/0.75ML ~~LOC~~ SOAJ
1.7000 mg | SUBCUTANEOUS | 0 refills | Status: DC
Start: 2023-03-06 — End: 2023-06-04

## 2023-03-06 MED ORDER — ALPRAZOLAM 0.5 MG PO TABS: ORAL_TABLET | ORAL | 0 refills | Status: DC

## 2023-03-06 NOTE — Patient Instructions (Signed)
It was great seeing you today   We will follow up with you regarding your lab work   Please let me know if you need anything   

## 2023-03-06 NOTE — Progress Notes (Signed)
Subjective:    Patient ID: Bruce Terry, male    DOB: Feb 05, 1971, 52 y.o.   MRN: [id number]  HPI Patient presents for yearly preventative medicine examination. He is a pleasant 52 year old male who  has a past medical history of GERD (gastroesophageal reflux disease), Hemorrhoid, Hyperlipidemia, Hypertension, and Low back pain.  Hyperlipidemia-prescribed Lipitor 80 mg daily.  He denies myalgia or fatigue Lab Results  Component Value Date   CHOL 114 01/10/2022   HDL 34.00 (L) 01/10/2022   LDLCALC 62 01/10/2022   LDLDIRECT 129.0 07/21/2017   TRIG 90.0 01/10/2022   CHOLHDL 3 01/10/2022    Hypertension-managed with lisinopril 20 mg at bedtime.  He denies dizziness, lightheadedness, chest pain, or shortness of breath. He does check his BP at home with readings consistently in the 120/80's.  BP Readings from Last 3 Encounters:  03/06/23 122/84  11/26/22 128/88  10/18/22 (!) 148/80   Chronic back pain-has had total 5 spinal surgeries and is followed by [facility]. His back pain is managed with Xtampza 13.5 mg BID and Oxycodone 10 mg 10 mg Q6 hours PRN  Right Shoulder Pain -he had surgery in June 2024 for right rotator cuff repair.  Reports that he has not healed completely from this.  He is unable to raise his arm past midline.  Has pretty significant pain.  He will have another MRI after the first of the year.  This MRI is supposed to be with anesthesia that he would like to try it without anesthesia and is wondering if I can prescribe him a low-dose Xanax to get him through the MRI.  Morbid Obesity - Currently maintained on Wegovy 1 mg weekly. He is tolerating this medication well. He has lost about 20 pounds over the last 4 months  Wt Readings from Last 10 Encounters:  03/06/23 273 lb (123.8 kg)  11/26/22 285 lb (129.3 kg)  10/18/22 294 lb (133.4 kg)  05/09/22 284 lb (128.8 kg)  03/15/22 296 lb (134.3 kg)  03/05/22 285 lb (129.3 kg)  02/12/22 292 lb 3.2  oz (132.5 kg)  01/10/22 290 lb (131.5 kg)  09/02/21 290 lb (131.5 kg)  08/29/21 296 lb (134.3 kg)    Low Testosterone - managed by urology with testosterone injections. He reports that his estrogen levels increased with the testosterone injections and he was placed on anastrozole   Concern for sleep apnea - symptoms include chronic fatigue, snoring, and poor sleep. He is unsure if he wakes up with apneic episodes.     All immunizations and health maintenance protocols were reviewed with the patient and needed orders were placed.  Appropriate screening laboratory values were ordered for the patient including screening of hyperlipidemia, renal function and hepatic function. If indicated by BPH, a PSA was ordered.  Medication reconciliation,  past medical history, social history, problem list and allergies were reviewed in detail with the patient  Goals were established with regard to weight loss, exercise, and  diet in compliance with medications. He does eat healthy but unable to do a lot of exercise due to chronic back pain and loss of ROM of right shoulder   He is up to date on routine colon cancer screening   Review of Systems  Constitutional: Negative.   HENT: Negative.    Eyes: Negative.   Respiratory: Negative.    Cardiovascular: Negative.   Gastrointestinal: Negative.   Endocrine: Negative.   Genitourinary: Negative.   Musculoskeletal:  Positive for arthralgias and  back pain.  Skin: Negative.   Allergic/Immunologic: Negative.   Neurological: Negative.   Hematological: Negative.   Psychiatric/Behavioral: Negative.    All other systems reviewed and are negative.  Past Medical History:  Diagnosis Date   GERD (gastroesophageal reflux disease)    was caused by another medication   Hemorrhoid    Hyperlipidemia    Hypertension    Low back pain     Social History   Socioeconomic History   Marital status: Married    Spouse name: Not on file   Number of children:  Not on file   Years of education: Not on file   Highest education level: Not on file  Occupational History   Not on file  Tobacco Use   Smoking status: Never   Smokeless tobacco: Never  Vaping Use   Vaping status: Never Used  Substance and Sexual Activity   Alcohol use: Yes    Alcohol/week: 4.0 standard drinks of alcohol    Types: 4 Cans of beer per week    Comment: occasional   Drug use: No   Sexual activity: Not on file  Other Topics Concern   Not on file  Social History Narrative   Not on file   Social Determinants of Health   Financial Resource Strain: Not on file  Food Insecurity: Not on file  Transportation Needs: Not on file  Physical Activity: Not on file  Stress: Not on file  Social Connections: Unknown (09/25/2021)   [facility]   Social Network    Social Network: Not on file  Intimate Partner Violence: Unknown (08/17/2021)   Received from [facility]   HITS    Physically Hurt: Not on file    Insult or Talk Down To: Not on file    Threaten Physical Harm: Not on file    Scream or Curse: Not on file    Past Surgical History:  Procedure Laterality Date   BACK SURGERY     x 5   COLONOSCOPY     ELBOW SURGERY Right    EYE SURGERY Bilateral 2022   cataracts   RADIOLOGY WITH ANESTHESIA N/A 02/08/2021   Procedure: MRI WITH ANESTHESIA WITH/WITHOUT CONTRAST;  Surgeon: Radiologist, Medication, MD;  Location: [facility] OR;  Service: Radiology;  Laterality: N/A;   RADIOLOGY WITH ANESTHESIA Right 05/09/2022   Procedure: MRI WITH ANESTHESIA RIGHT SHOULDER;  Surgeon: Radiologist, Medication, MD;  Location: [facility] OR;  Service: Radiology;  Laterality: Right;   right hand surgery Right    SHOULDER SURGERY Right    SPINAL CORD STIMULATOR IMPLANT  02/07/2021   at [facility] - has remote for stimulator    Family History  Problem Relation Age of Onset   Hypertension Mother    Cirrhosis Mother    Diabetes Mother    Hypothyroidism Mother     Obesity Mother    Kidney disease Mother    Alcohol abuse Father    Colon cancer Neg Hx    Esophageal cancer Neg Hx    Rectal cancer Neg Hx    Stomach cancer Neg Hx     Allergies  Allergen Reactions   Amitriptyline Nausea And Vomiting   Morphine And Codeine     GI upset    Current Outpatient Medications on File Prior to Visit  Medication Sig Dispense Refill   atorvastatin (LIPITOR) 80 MG tablet TAKE 1 TABLET BY MOUTH EVERY DAY 90 tablet 0   lisinopril (ZESTRIL) 20 MG tablet TAKE 1  TABLET BY MOUTH EVERYDAY AT BEDTIME 90 tablet 0   Oxycodone HCl 10 MG TABS Take 10 mg by mouth every 6 (six) hours as needed (pain).     testosterone cypionate (DEPOTESTOSTERONE CYPIONATE) 200 MG/ML injection Inject 150 mg into the skin every Sunday.     No current facility-administered medications on file prior to visit.    BP 122/84 Comment: home  Pulse 99   Temp 98.2 F (36.8 C) (Oral)   Ht 5\' 10"  (1.778 m)   Wt 273 lb (123.8 kg)   SpO2 97%   BMI 39.17 kg/m       Objective:   Physical Exam Vitals and nursing note reviewed.  Constitutional:      General: He is not in acute distress.    Appearance: Normal appearance. He is obese. He is not ill-appearing.  HENT:     Head: Normocephalic and atraumatic.     Right Ear: Tympanic membrane, ear canal and external ear normal. There is no impacted cerumen.     Left Ear: Tympanic membrane, ear canal and external ear normal. There is no impacted cerumen.     Nose: Nose normal. No congestion or rhinorrhea.     Mouth/Throat:     Mouth: Mucous membranes are moist.     Pharynx: Oropharynx is clear.  Eyes:     Extraocular Movements: Extraocular movements intact.     Conjunctiva/sclera: Conjunctivae normal.     Pupils: Pupils are equal, round, and reactive to light.  Neck:     Vascular: No carotid bruit.  Cardiovascular:     Rate and Rhythm: Normal rate and regular rhythm.     Pulses: Normal pulses.     Heart sounds: No murmur heard.    No  friction rub. No gallop.  Pulmonary:     Effort: Pulmonary effort is normal.     Breath sounds: Normal breath sounds.  Abdominal:     General: Abdomen is flat. Bowel sounds are normal. There is no distension.     Palpations: Abdomen is soft. There is no mass.     Tenderness: There is no abdominal tenderness. There is no guarding or rebound.     Hernia: No hernia is present.  Musculoskeletal:        General: No tenderness.     Right shoulder: Decreased range of motion. Decreased strength.     Cervical back: Normal range of motion and neck supple.  Lymphadenopathy:     Cervical: No cervical adenopathy.  Skin:    General: Skin is warm and dry.     Capillary Refill: Capillary refill takes less than 2 seconds.  Neurological:     General: No focal deficit present.     Mental Status: He is alert and oriented to person, place, and time.  Psychiatric:        Mood and Affect: Mood normal.        Behavior: Behavior normal.        Thought Content: Thought content normal.        Judgment: Judgment normal.       Assessment & Plan:  1. Routine general medical examination at a health care facility Today patient counseled on age appropriate routine health concerns for screening and prevention, each reviewed and up to date or declined. Immunizations reviewed and up to date or declined. Labs ordered and reviewed. Risk factors for depression reviewed and negative. Hearing function and visual acuity are intact. ADLs screened and addressed as needed. Functional ability and  level of safety reviewed and appropriate. Education, counseling and referrals performed based on assessed risks today. Patient provided with a copy of personalized plan for preventive services. - Follow up in one  year or CPE  - Continue to eat healthy  - Flu shot given today    2. Hyperlipidemia, unspecified hyperlipidemia type - Consider increase in statin  - Lipid panel; Future - TSH; Future - CBC; Future - Comprehensive  metabolic panel; Future - Comprehensive metabolic panel - CBC - TSH - Lipid panel  3. Essential hypertension - Controlled. No change - Lipid panel; Future - TSH; Future - CBC; Future - Comprehensive metabolic panel; Future - Comprehensive metabolic panel - CBC - TSH - Lipid panel  4. Chronic bilateral low back pain without sciatica - Per neurosurgery and spine  - Lipid panel; Future - TSH; Future - CBC; Future - Comprehensive metabolic panel; Future - Comprehensive metabolic panel - CBC - TSH - Lipid panel  5. Morbid obesity with BMI of 40.0-44.9, adult (HCC) - He is doing well with weight loss. Will increase Wegovy to 1.7 mg  - Follow up in 3 months  - Semaglutide-Weight Management (WEGOVY) 1.7 MG/0.75ML SOAJ; Inject 1.7 mg into the skin once a week.  Dispense: 9 mL; Refill: 0 - Lipid panel; Future - TSH; Future - CBC; Future - Comprehensive metabolic panel; Future - Comprehensive metabolic panel - CBC - TSH - Lipid panel  6. Low testosterone - Per Urology   7. Prostate cancer screening  - PSA; Future - PSA  8. Acute pain of right shoulder  - ALPRAZolam (XANAX) 0.5 MG tablet; Take one tablet 30 minutes prior to MRI and the second tab at time of MRI  Dispense: 2 tablet; Refill: 0

## 2023-03-07 LAB — CBC
HCT: 53.1 % — ABNORMAL HIGH (ref 39.0–52.0)
Hemoglobin: 17.2 g/dL — ABNORMAL HIGH (ref 13.0–17.0)
MCHC: 32.5 g/dL (ref 30.0–36.0)
MCV: 89.8 fL (ref 78.0–100.0)
Platelets: 260 10*3/uL (ref 150.0–400.0)
RBC: 5.91 Mil/uL — ABNORMAL HIGH (ref 4.22–5.81)
RDW: 14.6 % (ref 11.5–15.5)
WBC: 10.4 10*3/uL (ref 4.0–10.5)

## 2023-03-11 ENCOUNTER — Other Ambulatory Visit (HOSPITAL_COMMUNITY): Payer: Self-pay

## 2023-03-11 ENCOUNTER — Telehealth: Payer: Self-pay

## 2023-03-11 NOTE — Telephone Encounter (Signed)
Pharmacy Patient Advocate Encounter   Received notification from CoverMyMeds that prior authorization for wegovy is required/requested.   Insurance verification completed.   The patient is insured through [facility] .   Per test claim: PA required; PA submitted to above mentioned insurance via CoverMyMeds Key/confirmation #/EOC Key: [id number] Status is pending

## 2023-03-13 ENCOUNTER — Other Ambulatory Visit (HOSPITAL_COMMUNITY): Payer: Self-pay

## 2023-03-13 NOTE — Telephone Encounter (Signed)
Pharmacy Patient Advocate Encounter  Received notification from [facility] that Prior Authorization for Reginal Lutes has been APPROVED from 10.29.24 to 4.29.25. Ran test claim, and The Rx is payable again on/after 04/03/23. This test claim was processed through [facility]- copay amounts may vary at other pharmacies due to pharmacy/plan contracts, or as the patient moves through the different stages of their insurance plan.   PA #/Case ID/Reference #: Key: [id number]

## 2023-04-16 IMAGING — CT CT L SPINE W/O CM
3 of 4 series · 11 of 33 positions shown, 13 images · non-contrast
Comparison: Lumbar spine radiograph 09/02/2021, MRI lumbar spine
02/08/2021

CLINICAL DATA: Lower back pain



[Series 3: l-spine 2.00 br40 s3 (person_name) · axial · 0.33mm/px · z∈[+1372,+1548]mm · 3 of 133 slices shown, 4 images]
[im 23/133  soft-tissue]
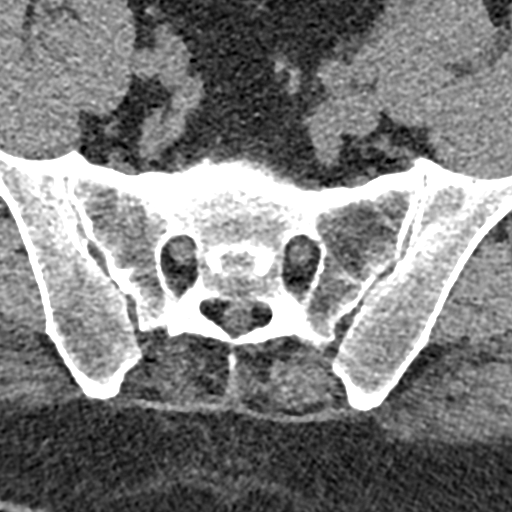
[im 23/133  bone]
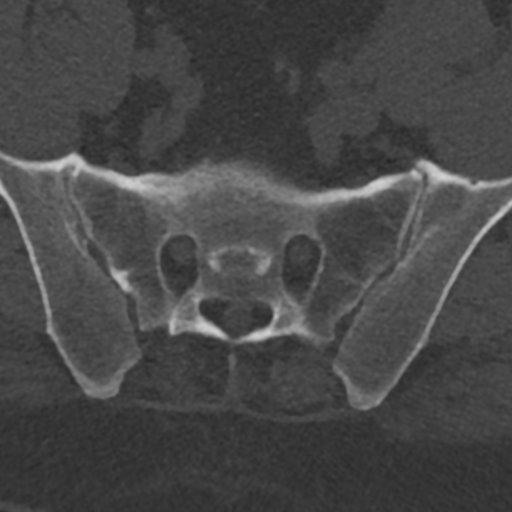
[im 67/133  bone]
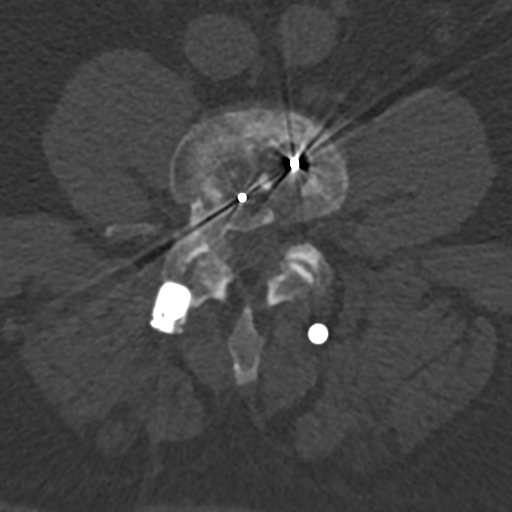
[im 111/133  bone]
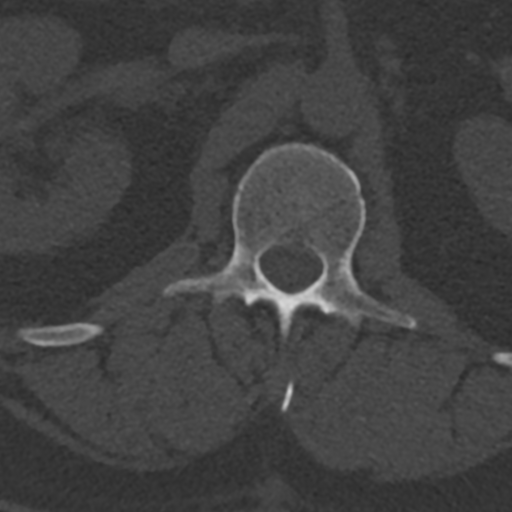

[Series 7: l-spine 2.00 br44 s3 sag soft · sagittal · 0.33mm/px · 5 of 80 slices shown, 6 images]
[im 27/80  bone]
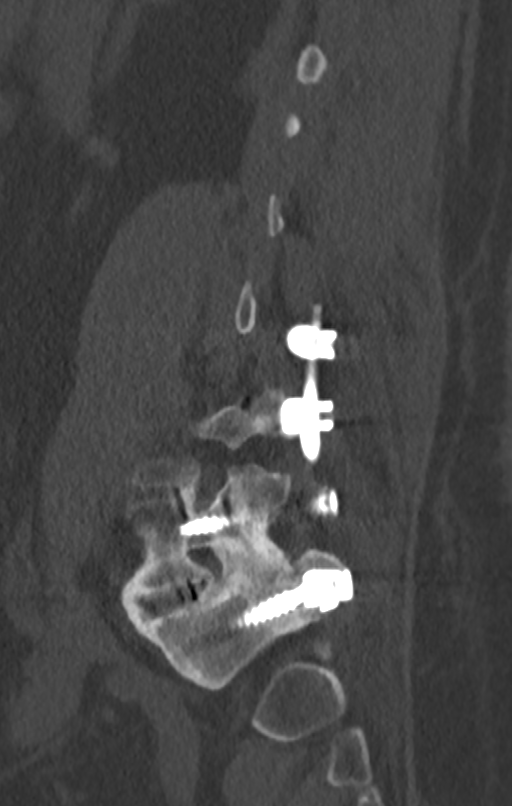
[im 33/80  bone]
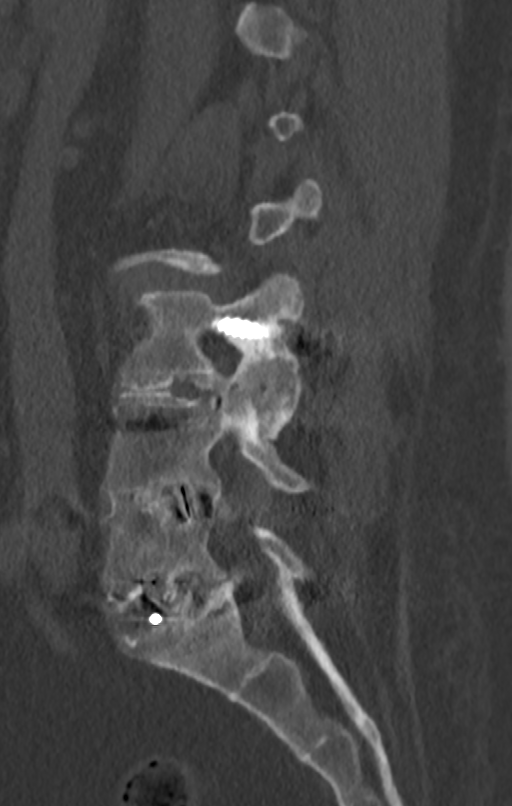
[im 40/80  soft-tissue]
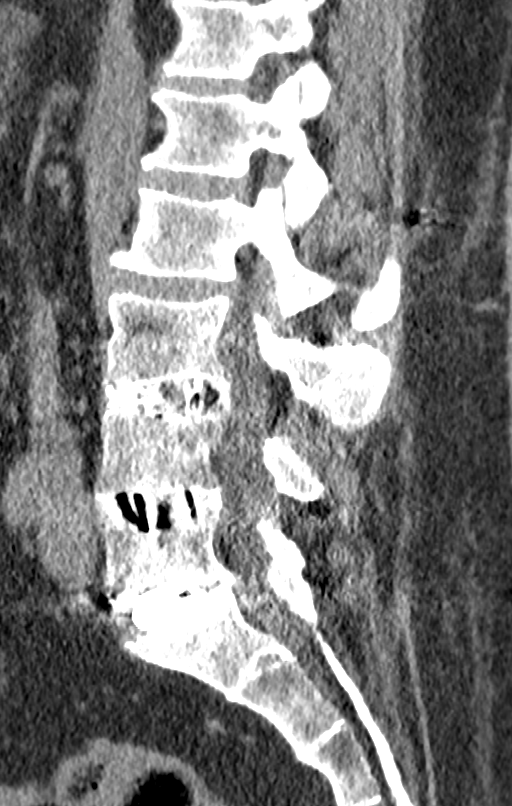
[im 40/80  bone]
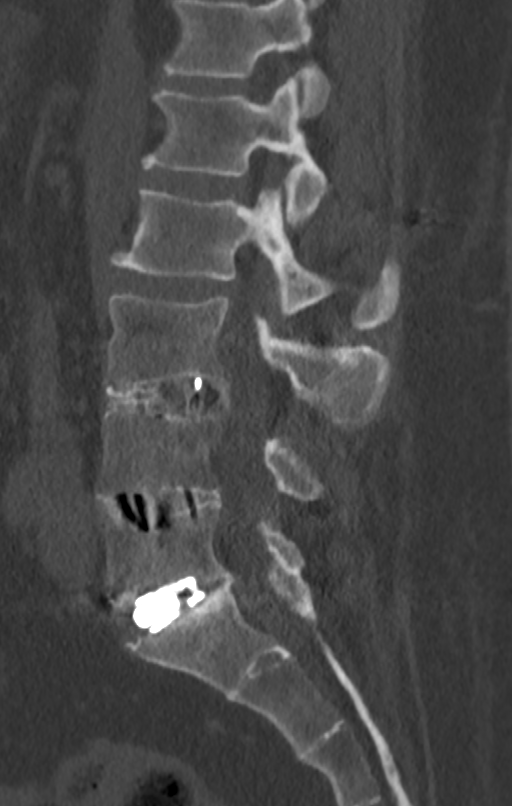
[im 47/80  bone]
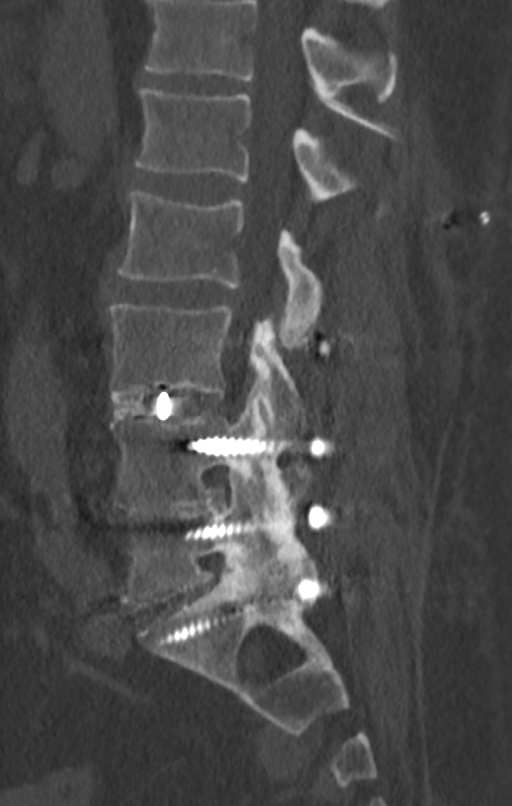
[im 53/80  bone]
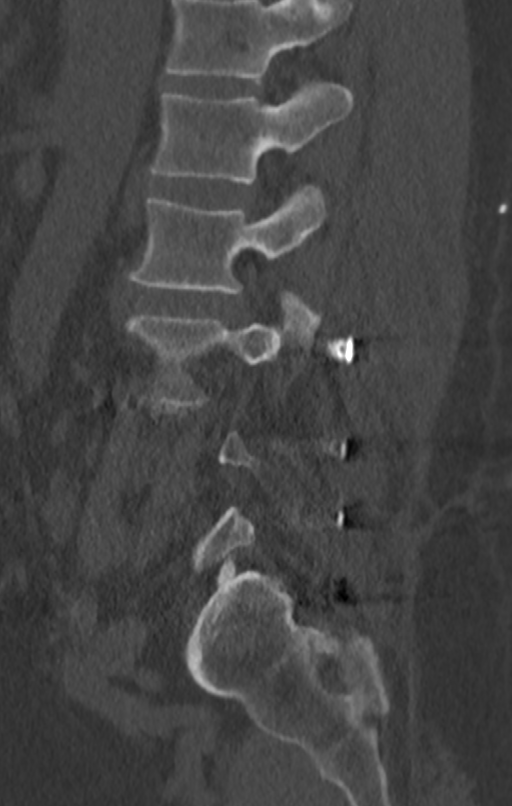

[Series 9: l-spine 2.00 br60 s3 cor bone · coronal · 0.33mm/px · 3 of 82 slices shown]
[im 17/82  bone]
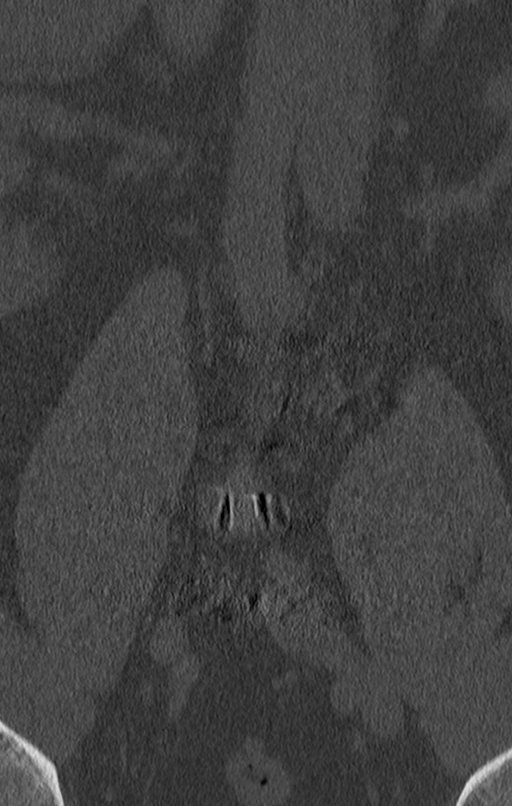
[im 33/82  bone]
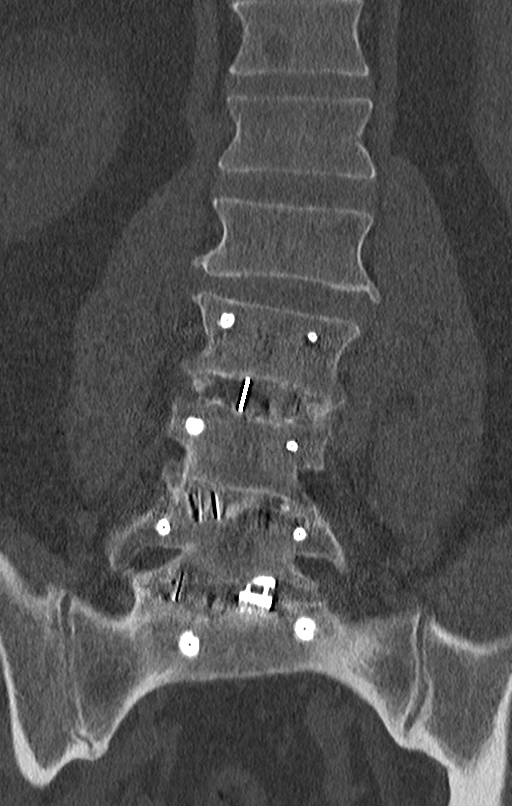
[im 49/82  bone]
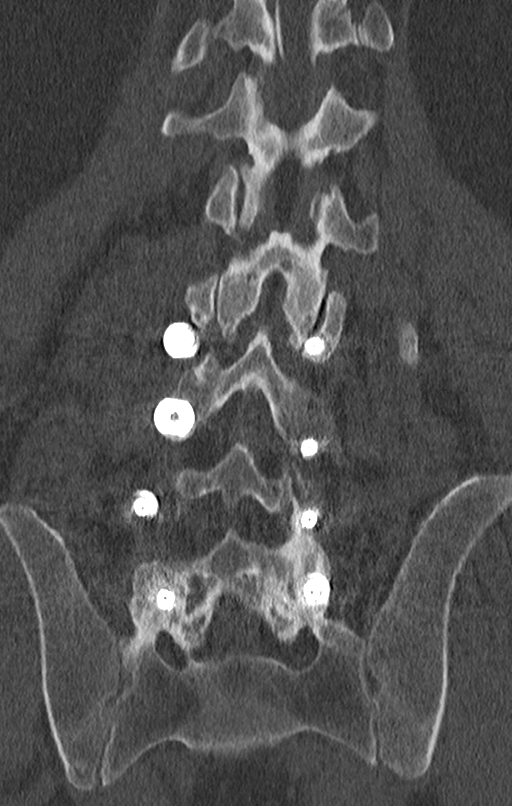

[11 of 33 positions shown; findings below may reference images not displayed]

FINDINGS: Segmentation: 5 lumbar type vertebrae.

Alignment: Slight levoconvex curvature.

Vertebrae: Prior PLIF from L3-S1. Hardware is intact without
evidence of loosening. There is solid arthrodesis at L3-L4 and
L4-L5. There is there is bony bridging along the right lateral
margin at L5-S1 but no central or left-sided solid arthrodesis.
There is no evidence of acute fracture. There is a spinal cord
stimulator with lead entering the interspinous space at the level of
T12-L1. There are unchanged hemangiomas within the T12 vertebral
body.

Paraspinal and other soft tissues: Negative.

Disc levels:

T12-L1: No significant stenosis.

L1-L2: No significant stenosis.

L2-L3: Progressive asymmetric left disc bulging with left foraminal
disc protrusion, ligament flavum hypertrophy and bilateral facet
arthropathy. There is mild spinal canal stenosis and moderate
left-sided neural foraminal stenosis.

L3-L4: PLIF with solid arthrodesis. There is right-sided bony
hypertrophy resulting in moderate narrowing of the right neural
foramen and leftward mass effect on the thecal sac. No spinal canal
or left-sided neural foraminal stenosis.

L4-L5: PLIF with solid arthrodesis. There is bony hypertrophy
bilaterally resulting and moderate neural foraminal stenosis. Patent
spinal canal.

L5-S1: PLIF with bony bridging along the far right lateral margin
but no central or left-sided solid arthrodesis. There is bony
hypertrophy bilaterally resulting in moderate neural foraminal
stenosis. Patent spinal canal.
IMPRESSION: Progressive asymmetric left disc bulging with left foraminal disc
protrusion at L2-L3, resulting in moderate left-sided neural
foraminal stenosis. Mild spinal canal stenosis at this level.

Prior PLIF from L3-S1. Solid arthrodesis at L3-L4 and L4-L5. Bony
bridging along the right lateral margin at L5-S1 but no central or
left-sided solid arthrodesis.

Bony hypertrophy results in moderate narrowing of the right neural
foramen at L3-L4 and the bilateral neural foramina at L4-L5 and
L5-S1. Patent spinal canal at these levels.

## 2023-05-05 DIAGNOSIS — F1120 Opioid dependence, uncomplicated: Secondary | ICD-10-CM | POA: Diagnosis not present

## 2023-05-05 DIAGNOSIS — G90522 Complex regional pain syndrome I of left lower limb: Secondary | ICD-10-CM | POA: Diagnosis not present

## 2023-05-05 DIAGNOSIS — G894 Chronic pain syndrome: Secondary | ICD-10-CM | POA: Diagnosis not present

## 2023-05-06 DIAGNOSIS — M67813 Other specified disorders of tendon, right shoulder: Secondary | ICD-10-CM | POA: Diagnosis not present

## 2023-05-06 DIAGNOSIS — M19011 Primary osteoarthritis, right shoulder: Secondary | ICD-10-CM | POA: Diagnosis not present

## 2023-05-06 DIAGNOSIS — S46011S Strain of muscle(s) and tendon(s) of the rotator cuff of right shoulder, sequela: Secondary | ICD-10-CM | POA: Diagnosis not present

## 2023-05-06 DIAGNOSIS — M65911 Unspecified synovitis and tenosynovitis, right shoulder: Secondary | ICD-10-CM | POA: Diagnosis not present

## 2023-05-06 DIAGNOSIS — S46011A Strain of muscle(s) and tendon(s) of the rotator cuff of right shoulder, initial encounter: Secondary | ICD-10-CM | POA: Diagnosis not present

## 2023-05-06 DIAGNOSIS — S46011D Strain of muscle(s) and tendon(s) of the rotator cuff of right shoulder, subsequent encounter: Secondary | ICD-10-CM | POA: Diagnosis not present

## 2023-05-06 DIAGNOSIS — X58XXXS Exposure to other specified factors, sequela: Secondary | ICD-10-CM | POA: Diagnosis not present

## 2023-05-06 DIAGNOSIS — M25511 Pain in right shoulder: Secondary | ICD-10-CM | POA: Diagnosis not present

## 2023-05-06 DIAGNOSIS — M62511 Muscle wasting and atrophy, not elsewhere classified, right shoulder: Secondary | ICD-10-CM | POA: Diagnosis not present

## 2023-05-19 DIAGNOSIS — S46011D Strain of muscle(s) and tendon(s) of the rotator cuff of right shoulder, subsequent encounter: Secondary | ICD-10-CM | POA: Diagnosis not present

## 2023-06-01 ENCOUNTER — Other Ambulatory Visit: Payer: Self-pay | Admitting: Adult Health

## 2023-06-03 NOTE — Telephone Encounter (Signed)
Okay for refill?  

## 2023-06-04 NOTE — Telephone Encounter (Signed)
Tried to call pt to schedule an appointment but no answer.

## 2023-06-13 DIAGNOSIS — Z125 Encounter for screening for malignant neoplasm of prostate: Secondary | ICD-10-CM | POA: Diagnosis not present

## 2023-06-13 DIAGNOSIS — E291 Testicular hypofunction: Secondary | ICD-10-CM | POA: Diagnosis not present

## 2023-06-19 ENCOUNTER — Ambulatory Visit (HOSPITAL_COMMUNITY): Admit: 2023-06-19 | Payer: BC Managed Care – PPO

## 2023-06-19 ENCOUNTER — Encounter (HOSPITAL_COMMUNITY): Payer: Self-pay

## 2023-06-19 ENCOUNTER — Ambulatory Visit (HOSPITAL_COMMUNITY): Admission: RE | Admit: 2023-06-19 | Payer: BC Managed Care – PPO | Source: Ambulatory Visit

## 2023-06-19 SURGERY — MRI WITH ANESTHESIA
Anesthesia: General | Laterality: Right

## 2023-06-20 DIAGNOSIS — E291 Testicular hypofunction: Secondary | ICD-10-CM | POA: Diagnosis not present

## 2023-06-20 DIAGNOSIS — N5201 Erectile dysfunction due to arterial insufficiency: Secondary | ICD-10-CM | POA: Diagnosis not present

## 2023-06-30 ENCOUNTER — Other Ambulatory Visit: Payer: Self-pay | Admitting: Adult Health

## 2023-06-30 DIAGNOSIS — M7501 Adhesive capsulitis of right shoulder: Secondary | ICD-10-CM | POA: Diagnosis not present

## 2023-06-30 DIAGNOSIS — Z0189 Encounter for other specified special examinations: Secondary | ICD-10-CM | POA: Diagnosis not present

## 2023-06-30 DIAGNOSIS — M7581 Other shoulder lesions, right shoulder: Secondary | ICD-10-CM | POA: Diagnosis not present

## 2023-06-30 DIAGNOSIS — Y999 Unspecified external cause status: Secondary | ICD-10-CM | POA: Diagnosis not present

## 2023-06-30 DIAGNOSIS — G8918 Other acute postprocedural pain: Secondary | ICD-10-CM | POA: Diagnosis not present

## 2023-06-30 DIAGNOSIS — M24111 Other articular cartilage disorders, right shoulder: Secondary | ICD-10-CM | POA: Diagnosis not present

## 2023-06-30 DIAGNOSIS — M75121 Complete rotator cuff tear or rupture of right shoulder, not specified as traumatic: Secondary | ICD-10-CM | POA: Diagnosis not present

## 2023-06-30 DIAGNOSIS — Z9889 Other specified postprocedural states: Secondary | ICD-10-CM | POA: Diagnosis not present

## 2023-06-30 DIAGNOSIS — S46011A Strain of muscle(s) and tendon(s) of the rotator cuff of right shoulder, initial encounter: Secondary | ICD-10-CM | POA: Diagnosis not present

## 2023-06-30 DIAGNOSIS — M25511 Pain in right shoulder: Secondary | ICD-10-CM | POA: Diagnosis not present

## 2023-06-30 DIAGNOSIS — S46011S Strain of muscle(s) and tendon(s) of the rotator cuff of right shoulder, sequela: Secondary | ICD-10-CM | POA: Diagnosis not present

## 2023-07-10 ENCOUNTER — Ambulatory Visit: Payer: BC Managed Care – PPO | Admitting: Adult Health

## 2023-07-10 NOTE — Progress Notes (Deleted)
 Subjective:    Patient ID: Bruce Terry, male    DOB: 1970/09/10, 53 y.o.   MRN: [id number]  HPI 53 year old male who  has a past medical history of GERD (gastroesophageal reflux disease), Hemorrhoid, Hyperlipidemia, Hypertension, and Low back pain.  He presents to the office today for follow up regarding Obesity and HTN.   Morbid Obesity - Started on Wegovy in June 2024. Currently maintained on Wegovy 1.7 mg weekly. He is tolerating this medication well.   Wt Readings from Last 5 Encounters:  03/06/23 273 lb (123.8 kg)  11/26/22 285 lb (129.3 kg)  10/18/22 294 lb (133.4 kg)  05/09/22 284 lb (128.8 kg)  03/15/22 296 lb (134.3 kg)    Hypertension-managed with lisinopril 20 mg at bedtime.  He denies dizziness, lightheadedness, chest pain, or shortness of breath. He does check his BP at home with readings consistently in the 120/80's.  BP Readings from Last 3 Encounters:  03/06/23 122/84  11/26/22 128/88  10/18/22 (!) 148/80    Review of Systems See HPI   Past Medical History:  Diagnosis Date   GERD (gastroesophageal reflux disease)    was caused by another medication   Hemorrhoid    Hyperlipidemia    Hypertension    Low back pain     Social History   Socioeconomic History   Marital status: Married    Spouse name: Not on file   Number of children: Not on file   Years of education: Not on file   Highest education level: Not on file  Occupational History   Not on file  Tobacco Use   Smoking status: Never   Smokeless tobacco: Never  Vaping Use   Vaping status: Never Used  Substance and Sexual Activity   Alcohol use: Yes    Alcohol/week: 4.0 standard drinks of alcohol    Types: 4 Cans of beer per week    Comment: occasional   Drug use: No   Sexual activity: Not on file  Other Topics Concern   Not on file  Social History Narrative   Not on file   Social Drivers of Health   Financial Resource Strain: Not on file  Food Insecurity: Not on file   Transportation Needs: Not on file  Physical Activity: Not on file  Stress: Not on file  Social Connections: Unknown (09/25/2021)   Received from [facility]   Social Network    Social Network: Not on file  Intimate Partner Violence: Unknown (08/17/2021)   [facility] from [facility]   HITS    Physically Hurt: Not on file    Insult or Talk Down To: Not on file    Threaten Physical Harm: Not on file    Scream or Curse: Not on file    Past Surgical History:  Procedure Laterality Date   BACK SURGERY     x 5   COLONOSCOPY     ELBOW SURGERY Right    EYE SURGERY Bilateral 2022   cataracts   RADIOLOGY WITH ANESTHESIA N/A 02/08/2021   Procedure: MRI WITH ANESTHESIA WITH/WITHOUT CONTRAST;  Surgeon: Radiologist, Medication, MD;  Location: [facility] OR;  Service: Radiology;  Laterality: N/A;   RADIOLOGY WITH ANESTHESIA Right 05/09/2022   Procedure: MRI WITH ANESTHESIA RIGHT SHOULDER;  Surgeon: Radiologist, Medication, MD;  Location: [facility] OR;  Service: Radiology;  Laterality: Right;   right hand surgery Right    SHOULDER SURGERY Right    SPINAL CORD STIMULATOR IMPLANT  02/07/2021  at [facility] - has remote for stimulator    Family History  Problem Relation Age of Onset   Hypertension Mother    Cirrhosis Mother    Diabetes Mother    Hypothyroidism Mother    Obesity Mother    Kidney disease Mother    Alcohol abuse Father    Colon cancer Neg Hx    Esophageal cancer Neg Hx    Rectal cancer Neg Hx    Stomach cancer Neg Hx     Allergies  Allergen Reactions   Amitriptyline Nausea And Vomiting   Morphine And Codeine     GI upset    Current Outpatient Medications on File Prior to Visit  Medication Sig Dispense Refill   ALPRAZolam (XANAX) 0.5 MG tablet Take one tablet 30 minutes prior to MRI and the second tab at time of MRI 2 tablet 0   atorvastatin (LIPITOR) 80 MG tablet TAKE 1 TABLET BY MOUTH EVERY DAY 90 tablet 0   lisinopril  (ZESTRIL) 20 MG tablet TAKE 1 TABLET BY MOUTH EVERYDAY AT BEDTIME 90 tablet 0   Oxycodone HCl 10 MG TABS Take 10 mg by mouth every 6 (six) hours as needed (pain).     Semaglutide-Weight Management (WEGOVY) 1.7 MG/0.75ML SOAJ INJECT 1.7 MG INTO THE SKIN ONCE A WEEK. 0.75 mL 1   testosterone cypionate (DEPOTESTOSTERONE CYPIONATE) 200 MG/ML injection Inject 150 mg into the skin every Sunday.     No current facility-administered medications on file prior to visit.    There were no vitals taken for this visit.      Objective:   Physical Exam Vitals and nursing note reviewed.  Constitutional:      Appearance: Normal appearance. He is obese.  Cardiovascular:     Rate and Rhythm: Normal rate and regular rhythm.     Pulses: Normal pulses.     Heart sounds: Normal heart sounds.  Pulmonary:     Effort: Pulmonary effort is normal.     Breath sounds: Normal breath sounds.  Skin:    General: Skin is warm and dry.  Neurological:     General: No focal deficit present.     Mental Status: He is alert and oriented to person, place, and time.  Psychiatric:        Mood and Affect: Mood normal.        Behavior: Behavior normal.        Thought Content: Thought content normal.        Judgment: Judgment normal.           Assessment & Plan:

## 2023-07-14 DIAGNOSIS — S46011S Strain of muscle(s) and tendon(s) of the rotator cuff of right shoulder, sequela: Secondary | ICD-10-CM | POA: Diagnosis not present

## 2023-07-22 ENCOUNTER — Ambulatory Visit (INDEPENDENT_AMBULATORY_CARE_PROVIDER_SITE_OTHER): Admitting: Adult Health

## 2023-07-22 VITALS — BP 126/84 | HR 85 | Temp 98.6°F | Ht 70.0 in | Wt 272.0 lb

## 2023-07-22 DIAGNOSIS — I10 Essential (primary) hypertension: Secondary | ICD-10-CM

## 2023-07-22 DIAGNOSIS — Z152 Genetic susceptibility to obesity: Secondary | ICD-10-CM

## 2023-07-22 DIAGNOSIS — E66812 Obesity, class 2: Secondary | ICD-10-CM

## 2023-07-22 DIAGNOSIS — E8882 Obesity due to disruption of MC4R pathway: Secondary | ICD-10-CM | POA: Diagnosis not present

## 2023-07-22 DIAGNOSIS — Z6839 Body mass index (BMI) 39.0-39.9, adult: Secondary | ICD-10-CM

## 2023-07-22 MED ORDER — SEMAGLUTIDE-WEIGHT MANAGEMENT 2.4 MG/0.75ML ~~LOC~~ SOAJ: 2.4000 mg | SUBCUTANEOUS | 1 refills | Status: DC

## 2023-07-22 NOTE — Progress Notes (Signed)
 Subjective:    Patient ID: Bruce Terry, male    DOB: Jan 16, 1971, 53 y.o.   MRN: [id number]  HPI 53 year old male who  has a past medical history of GERD (gastroesophageal reflux disease), Hemorrhoid, Hyperlipidemia, Hypertension, and Low back pain.  He presents to the office today for follow up regarding obesity. He is currently managed with Wegovy 1.7 mg weekly. Unfortunately  he had rotator cuff surgery a few weeks ago and has not been able to exercise. He reports that lately he has been more hungry. He is tolerating Wegovy well. His heaiest weight was 315 pounds prior to starting [facility].   Wt Readings from Last 10 Encounters:  07/22/23 272 lb (123.4 kg)  03/06/23 273 lb (123.8 kg)  11/26/22 285 lb (129.3 kg)  10/18/22 294 lb (133.4 kg)  05/09/22 284 lb (128.8 kg)  03/15/22 296 lb (134.3 kg)  03/05/22 285 lb (129.3 kg)  02/12/22 292 lb 3.2 oz (132.5 kg)  01/10/22 290 lb (131.5 kg)  09/02/21 290 lb (131.5 kg)   HTN - managed with lisinopril 20 mg. He has not had any dizziness, lightheadedness, blurred vision or headaches.  BP Readings from Last 3 Encounters:  07/22/23 126/84  03/06/23 122/84  11/26/22 128/88     Review of Systems See HPI   Past Medical History:  Diagnosis Date   GERD (gastroesophageal reflux disease)    was caused by another medication   Hemorrhoid    Hyperlipidemia    Hypertension    Low back pain     Social History   Socioeconomic History   Marital status: Married    Spouse name: Not on file   Number of children: Not on file   Years of education: Not on file   Highest education level: Not on file  Occupational History   Not on file  Tobacco Use   Smoking status: Never   Smokeless tobacco: Never  Vaping Use   Vaping status: Never Used  Substance and Sexual Activity   Alcohol use: Yes    Alcohol/week: 4.0 standard drinks of alcohol    Types: 4 Cans of beer per week    Comment: occasional   Drug use: No   Sexual activity: Not  on file  Other Topics Concern   Not on file  Social History Narrative   Not on file   Social Drivers of Health   Financial Resource Strain: Not on file  Food Insecurity: Not on file  Transportation Needs: Not on file  Physical Activity: Not on file  Stress: Not on file  Social Connections: Unknown (09/25/2021)   Received from [facility], [facility]   Social Network    Social Network: Not on file  Intimate Partner Violence: Unknown (08/17/2021)   Received from [facility], [facility]   HITS    Physically Hurt: Not on file    Insult or Talk Down To: Not on file    Threaten Physical Harm: Not on file    Scream or Curse: Not on file    Past Surgical History:  Procedure Laterality Date   BACK SURGERY     x 5   COLONOSCOPY     ELBOW SURGERY Right    EYE SURGERY Bilateral 2022   cataracts   RADIOLOGY WITH ANESTHESIA N/A 02/08/2021   Procedure: MRI WITH ANESTHESIA WITH/WITHOUT CONTRAST;  Surgeon: Radiologist, Medication, MD;  Location: [facility] OR;  Service: Radiology;  Laterality: N/A;   RADIOLOGY WITH ANESTHESIA Right 05/09/2022  Procedure: MRI WITH ANESTHESIA RIGHT SHOULDER;  Surgeon: Radiologist, Medication, MD;  Location: [facility] OR;  Service: Radiology;  Laterality: Right;   right hand surgery Right    SHOULDER SURGERY Right    SPINAL CORD STIMULATOR IMPLANT  02/07/2021   at [facility] - has remote for stimulator    Family History  Problem Relation Age of Onset   Hypertension Mother    Cirrhosis Mother    Diabetes Mother    Hypothyroidism Mother    Obesity Mother    Kidney disease Mother    Alcohol abuse Father    Colon cancer Neg Hx    Esophageal cancer Neg Hx    Rectal cancer Neg Hx    Stomach cancer Neg Hx     Allergies  Allergen Reactions   Amitriptyline Nausea And Vomiting   Morphine And Codeine     GI upset    Current Outpatient Medications on File Prior to Visit  Medication Sig Dispense Refill   ALPRAZolam (XANAX) 0.5 MG  tablet Take one tablet 30 minutes prior to MRI and the second tab at time of MRI 2 tablet 0   aspirin EC 81 MG tablet Take by mouth.     atorvastatin (LIPITOR) 80 MG tablet TAKE 1 TABLET BY MOUTH EVERY DAY 90 tablet 0   celecoxib (CELEBREX) 100 MG capsule Take 200 mg by mouth 2 (two) times daily as needed.     cholecalciferol (VITAMIN D3) 25 MCG (1000 UNIT) tablet Take by mouth.     doxycycline (MONODOX) 100 MG capsule Take 100 mg by mouth 2 (two) times daily.     doxycycline (VIBRAMYCIN) 100 MG capsule Take by mouth.     Lidocaine 5 % CREA apply as directed to affected area     lisinopril (ZESTRIL) 20 MG tablet TAKE 1 TABLET BY MOUTH EVERYDAY AT BEDTIME 90 tablet 0   methylPREDNISolone (MEDROL DOSEPAK) 4 MG TBPK tablet Take by mouth.     ondansetron (ZOFRAN-ODT) 4 MG disintegrating tablet Take 4 mg by mouth every 8 (eight) hours as needed.     Oxycodone HCl 10 MG TABS Take 10 mg by mouth every 6 (six) hours as needed (pain).     Semaglutide-Weight Management (WEGOVY) 1.7 MG/0.75ML SOAJ INJECT 1.7 MG INTO THE SKIN ONCE A WEEK. 0.75 mL 1   testosterone cypionate (DEPOTESTOSTERONE CYPIONATE) 200 MG/ML injection Inject 150 mg into the skin every Sunday. 0.8 dosage every sunday     No current facility-administered medications on file prior to visit.    BP 126/84   Pulse 85   Temp 98.6 F (37 C) (Oral)   Ht 5\' 10"  (1.778 m)   Wt 272 lb (123.4 kg)   SpO2 96%   BMI 39.03 kg/m       Objective:   Physical Exam Vitals and nursing note reviewed.  Constitutional:      Appearance: Normal appearance.  Cardiovascular:     Rate and Rhythm: Normal rate and regular rhythm.     Pulses: Normal pulses.     Heart sounds: Normal heart sounds.  Pulmonary:     Effort: Pulmonary effort is normal.     Breath sounds: Normal breath sounds.  Skin:    General: Skin is warm and dry.  Neurological:     General: No focal deficit present.     Mental Status: He is alert and oriented to person, place, and  time.  Psychiatric:        Mood and  Affect: Mood normal.        Behavior: Behavior normal.        Thought Content: Thought content normal.        Judgment: Judgment normal.        Assessment & Plan:  1. 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  - Follow up in 3 months  - Semaglutide-Weight Management 2.4 MG/0.75ML SOAJ; Inject 2.4 mg into the skin once a week.  Dispense: 9 mL; Refill: 1  2. Essential hypertension - well controlled. No change in medication   Shirline Frees, NP

## 2023-07-25 DIAGNOSIS — M25511 Pain in right shoulder: Secondary | ICD-10-CM | POA: Diagnosis not present

## 2023-07-25 DIAGNOSIS — Z4789 Encounter for other orthopedic aftercare: Secondary | ICD-10-CM | POA: Diagnosis not present

## 2023-07-28 DIAGNOSIS — Z4789 Encounter for other orthopedic aftercare: Secondary | ICD-10-CM | POA: Diagnosis not present

## 2023-07-28 DIAGNOSIS — M25511 Pain in right shoulder: Secondary | ICD-10-CM | POA: Diagnosis not present

## 2023-08-05 DIAGNOSIS — G894 Chronic pain syndrome: Secondary | ICD-10-CM | POA: Diagnosis not present

## 2023-08-05 DIAGNOSIS — G90522 Complex regional pain syndrome I of left lower limb: Secondary | ICD-10-CM | POA: Diagnosis not present

## 2023-08-05 DIAGNOSIS — F1120 Opioid dependence, uncomplicated: Secondary | ICD-10-CM | POA: Diagnosis not present

## 2023-08-11 ENCOUNTER — Other Ambulatory Visit: Payer: Self-pay | Admitting: Adult Health

## 2023-08-11 DIAGNOSIS — I10 Essential (primary) hypertension: Secondary | ICD-10-CM

## 2023-08-11 DIAGNOSIS — S46011S Strain of muscle(s) and tendon(s) of the rotator cuff of right shoulder, sequela: Secondary | ICD-10-CM | POA: Diagnosis not present

## 2023-08-13 DIAGNOSIS — Z4789 Encounter for other orthopedic aftercare: Secondary | ICD-10-CM | POA: Diagnosis not present

## 2023-08-13 DIAGNOSIS — M25511 Pain in right shoulder: Secondary | ICD-10-CM | POA: Diagnosis not present

## 2023-08-20 DIAGNOSIS — Z4789 Encounter for other orthopedic aftercare: Secondary | ICD-10-CM | POA: Diagnosis not present

## 2023-08-20 DIAGNOSIS — M25511 Pain in right shoulder: Secondary | ICD-10-CM | POA: Diagnosis not present

## 2023-08-22 DIAGNOSIS — S46011S Strain of muscle(s) and tendon(s) of the rotator cuff of right shoulder, sequela: Secondary | ICD-10-CM | POA: Diagnosis not present

## 2023-08-22 DIAGNOSIS — S46011D Strain of muscle(s) and tendon(s) of the rotator cuff of right shoulder, subsequent encounter: Secondary | ICD-10-CM | POA: Diagnosis not present

## 2023-08-29 DIAGNOSIS — Z4789 Encounter for other orthopedic aftercare: Secondary | ICD-10-CM | POA: Diagnosis not present

## 2023-08-29 DIAGNOSIS — M25511 Pain in right shoulder: Secondary | ICD-10-CM | POA: Diagnosis not present

## 2023-09-01 DIAGNOSIS — Z4789 Encounter for other orthopedic aftercare: Secondary | ICD-10-CM | POA: Diagnosis not present

## 2023-09-01 DIAGNOSIS — M25511 Pain in right shoulder: Secondary | ICD-10-CM | POA: Diagnosis not present

## 2023-09-08 DIAGNOSIS — Z4789 Encounter for other orthopedic aftercare: Secondary | ICD-10-CM | POA: Diagnosis not present

## 2023-09-08 DIAGNOSIS — M25511 Pain in right shoulder: Secondary | ICD-10-CM | POA: Diagnosis not present

## 2023-09-15 DIAGNOSIS — M25511 Pain in right shoulder: Secondary | ICD-10-CM | POA: Diagnosis not present

## 2023-09-15 DIAGNOSIS — Z4789 Encounter for other orthopedic aftercare: Secondary | ICD-10-CM | POA: Diagnosis not present

## 2023-09-22 DIAGNOSIS — S46011S Strain of muscle(s) and tendon(s) of the rotator cuff of right shoulder, sequela: Secondary | ICD-10-CM | POA: Diagnosis not present

## 2023-09-24 DIAGNOSIS — Z4789 Encounter for other orthopedic aftercare: Secondary | ICD-10-CM | POA: Diagnosis not present

## 2023-09-24 DIAGNOSIS — M25511 Pain in right shoulder: Secondary | ICD-10-CM | POA: Diagnosis not present

## 2023-09-29 DIAGNOSIS — Z4789 Encounter for other orthopedic aftercare: Secondary | ICD-10-CM | POA: Diagnosis not present

## 2023-09-29 DIAGNOSIS — M25511 Pain in right shoulder: Secondary | ICD-10-CM | POA: Diagnosis not present

## 2023-10-13 DIAGNOSIS — M25511 Pain in right shoulder: Secondary | ICD-10-CM | POA: Diagnosis not present

## 2023-10-13 DIAGNOSIS — Z4789 Encounter for other orthopedic aftercare: Secondary | ICD-10-CM | POA: Diagnosis not present

## 2023-10-14 DIAGNOSIS — Z125 Encounter for screening for malignant neoplasm of prostate: Secondary | ICD-10-CM | POA: Diagnosis not present

## 2023-10-14 DIAGNOSIS — E291 Testicular hypofunction: Secondary | ICD-10-CM | POA: Diagnosis not present

## 2023-10-20 DIAGNOSIS — M25511 Pain in right shoulder: Secondary | ICD-10-CM | POA: Diagnosis not present

## 2023-10-20 DIAGNOSIS — Z4789 Encounter for other orthopedic aftercare: Secondary | ICD-10-CM | POA: Diagnosis not present

## 2023-10-21 DIAGNOSIS — E291 Testicular hypofunction: Secondary | ICD-10-CM | POA: Diagnosis not present

## 2023-10-21 DIAGNOSIS — N5201 Erectile dysfunction due to arterial insufficiency: Secondary | ICD-10-CM | POA: Diagnosis not present

## 2023-10-22 ENCOUNTER — Ambulatory Visit: Admitting: Adult Health

## 2023-10-22 ENCOUNTER — Other Ambulatory Visit (HOSPITAL_COMMUNITY): Payer: Self-pay

## 2023-10-22 ENCOUNTER — Encounter: Payer: Self-pay | Admitting: Adult Health

## 2023-10-22 ENCOUNTER — Telehealth: Payer: Self-pay

## 2023-10-22 VITALS — BP 120/80 | HR 87 | Temp 98.5°F | Ht 70.0 in | Wt 268.0 lb

## 2023-10-22 DIAGNOSIS — Z6839 Body mass index (BMI) 39.0-39.9, adult: Secondary | ICD-10-CM

## 2023-10-22 DIAGNOSIS — E8882 Obesity due to disruption of MC4R pathway: Secondary | ICD-10-CM | POA: Diagnosis not present

## 2023-10-22 DIAGNOSIS — I10 Essential (primary) hypertension: Secondary | ICD-10-CM

## 2023-10-22 DIAGNOSIS — Z152 Genetic susceptibility to obesity: Secondary | ICD-10-CM | POA: Diagnosis not present

## 2023-10-22 DIAGNOSIS — E66812 Obesity, class 2: Secondary | ICD-10-CM | POA: Diagnosis not present

## 2023-10-22 MED ORDER — SEMAGLUTIDE-WEIGHT MANAGEMENT 2.4 MG/0.75ML ~~LOC~~ SOAJ: 2.4000 mg | SUBCUTANEOUS | 1 refills | Status: DC

## 2023-10-22 NOTE — Telephone Encounter (Signed)
 Pharmacy Patient Advocate Encounter   Received notification from Patient Pharmacy that prior authorization for Wegovy  2.4 is required/requested.   Insurance verification completed.   The patient is insured through [facility] .   Per test claim: PA required; PA submitted to above mentioned insurance via CoverMyMeds Key/confirmation #/EOC [id number] Status is pending

## 2023-10-22 NOTE — Telephone Encounter (Signed)
 Pharmacy Patient Advocate Encounter  Received notification from [employer] that Prior Authorization for Wegovy  2.4 has been APPROVED from 10/22/23 to 04/22/24.   PA #/Case ID/Reference #: [id number]  Unable to test bill for co-pay. Patient MUST enroll in [employer] program

## 2023-10-22 NOTE — Patient Instructions (Signed)
 It was great seeing you today   I will see you back after October 24th for your annual exam

## 2023-10-22 NOTE — Telephone Encounter (Signed)
 Patient notified of update  and verbalized understanding.

## 2023-10-22 NOTE — Progress Notes (Signed)
 Subjective:    Patient ID: Bruce Terry, male    DOB: 05/23/1970, 53 y.o.   MRN: [id number]  HPI 53 year old male who  has a past medical history of GERD (gastroesophageal reflux disease), Hemorrhoid, Hyperlipidemia, Hypertension, and Low back pain.  He presents to the office today for follow up regarding obesity and HTN  He is currently managed with Wegovy  2.4 mg weekly.He has been walking more frequently and has becoming more active after rotator cuff repair. He is also eating more vegetables and good proteins.  He is tolerating Wegovy  well. His heaviest weight was 315 pounds prior to starting Wegovy .   Wt Readings from Last 3 Encounters:  10/22/23 268 lb (121.6 kg)  07/22/23 272 lb (123.4 kg)  03/06/23 273 lb (123.8 kg)   HTN - managed with lisinopril  20 mg. He has not had any dizziness, lightheadedness, blurred vision or headaches.  BP Readings from Last 3 Encounters:  10/22/23 120/80  07/22/23 126/84  03/06/23 122/84     Review of Systems See HPI   Past Medical History:  Diagnosis Date   GERD (gastroesophageal reflux disease)    was caused by another medication   Hemorrhoid    Hyperlipidemia    Hypertension    Low back pain     Social History   Socioeconomic History   Marital status: Married    Spouse name: Not on file   Number of children: Not on file   Years of education: Not on file   Highest education level: Not on file  Occupational History   Not on file  Tobacco Use   Smoking status: Never   Smokeless tobacco: Never  Vaping Use   Vaping status: Never Used  Substance and Sexual Activity   Alcohol use: Yes    Alcohol/week: 4.0 standard drinks of alcohol    Types: 4 Cans of beer per week    Comment: occasional   Drug use: No   Sexual activity: Not on file  Other Topics Concern   Not on file  Social History Narrative   Not on file   Social Drivers of Health   Financial Resource Strain: Not on file  Food Insecurity: Not on file   Transportation Needs: Not on file  Physical Activity: Not on file  Stress: Not on file  Social Connections: Unknown (09/25/2021)   Received from [facility], [facility]   Social Network    Social Network: Not on file  Intimate Partner Violence: Unknown (08/17/2021)   Received from [facility], [facility]   HITS    Physically Hurt: Not on file    Insult or Talk Down To: Not on file    Threaten Physical Harm: Not on file    Scream or Curse: Not on file    Past Surgical History:  Procedure Laterality Date   BACK SURGERY     x 5   COLONOSCOPY     ELBOW SURGERY Right    EYE SURGERY Bilateral 2022   cataracts   RADIOLOGY WITH ANESTHESIA N/A 02/08/2021   Procedure: MRI WITH ANESTHESIA WITH/WITHOUT CONTRAST;  Surgeon: Radiologist, Medication, MD;  Location: [facility] OR;  Service: Radiology;  Laterality: N/A;   RADIOLOGY WITH ANESTHESIA Right 05/09/2022   Procedure: MRI WITH ANESTHESIA RIGHT SHOULDER;  Surgeon: Radiologist, Medication, MD;  Location: [facility] OR;  Service: Radiology;  Laterality: Right;   right hand surgery Right    SHOULDER SURGERY Right    SPINAL CORD STIMULATOR IMPLANT  02/07/2021  at [facility] - has remote for stimulator    Family History  Problem Relation Age of Onset   Hypertension Mother    Cirrhosis Mother    Diabetes Mother    Hypothyroidism Mother    Obesity Mother    Kidney disease Mother    Alcohol abuse Father    Colon cancer Neg Hx    Esophageal cancer Neg Hx    Rectal cancer Neg Hx    Stomach cancer Neg Hx     Allergies  Allergen Reactions   Amitriptyline  Nausea And Vomiting   Morphine And Codeine     GI upset    Current Outpatient Medications on File Prior to Visit  Medication Sig Dispense Refill   ALPRAZolam  (XANAX ) 0.5 MG tablet Take one tablet 30 minutes prior to MRI and the second tab at time of MRI 2 tablet 0   atorvastatin  (LIPITOR ) 80 MG tablet TAKE 1 TABLET BY MOUTH EVERY DAY 90 tablet 0   celecoxib  (CELEBREX) 100 MG capsule Take 200 mg by mouth 2 (two) times daily as needed.     doxycycline (MONODOX) 100 MG capsule Take 100 mg by mouth 2 (two) times daily.     Lidocaine  5 % CREA apply as directed to affected area     lisinopril  (ZESTRIL ) 20 MG tablet TAKE 1 TABLET BY MOUTH EVERYDAY AT BEDTIME 90 tablet 0   methylPREDNISolone (MEDROL DOSEPAK) 4 MG TBPK tablet Take by mouth.     ondansetron  (ZOFRAN -ODT) 4 MG disintegrating tablet Take 4 mg by mouth every 8 (eight) hours as needed.     Oxycodone  HCl 10 MG TABS Take 10 mg by mouth every 6 (six) hours as needed (pain).     Semaglutide -Weight Management 2.4 MG/0.75ML SOAJ Inject 2.4 mg into the skin once a week. 9 mL 1   testosterone  cypionate (DEPOTESTOSTERONE CYPIONATE) 200 MG/ML injection Inject 150 mg into the skin every Sunday. 0.8 dosage every sunday     No current facility-administered medications on file prior to visit.    BP 120/80   Pulse 87   Temp 98.5 F (36.9 C) (Oral)   Ht 5' 10 (1.778 m)   Wt 268 lb (121.6 kg)   SpO2 97%   BMI 38.45 kg/m       Objective:   Physical Exam Vitals and nursing note reviewed.  Constitutional:      Appearance: Normal appearance. He is obese.  Cardiovascular:     Rate and Rhythm: Normal rate and regular rhythm.     Pulses: Normal pulses.     Heart sounds: Normal heart sounds.  Pulmonary:     Effort: Pulmonary effort is normal.     Breath sounds: Normal breath sounds.  Skin:    General: Skin is warm and dry.  Neurological:     General: No focal deficit present.     Mental Status: He is alert and oriented to person, place, and time.  Psychiatric:        Mood and Affect: Mood normal.        Behavior: Behavior normal.        Thought Content: Thought content normal.        Judgment: Judgment normal.        Assessment & Plan:   1. 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 Increase activity  as tolerated - Follow up for CPE in October/November - Semaglutide -Weight Management 2.4 MG/0.75ML SOAJ; Inject 2.4 mg into the skin once a week.  Dispense: 9 mL; Refill: 1  2. Essential hypertension - Controlled. No change in medication    Alto Atta, NP

## 2023-10-24 ENCOUNTER — Other Ambulatory Visit: Payer: Self-pay | Admitting: Adult Health

## 2023-10-24 DIAGNOSIS — Z4789 Encounter for other orthopedic aftercare: Secondary | ICD-10-CM | POA: Diagnosis not present

## 2023-10-24 DIAGNOSIS — E8882 Obesity due to disruption of MC4R pathway: Secondary | ICD-10-CM

## 2023-10-24 DIAGNOSIS — M25511 Pain in right shoulder: Secondary | ICD-10-CM | POA: Diagnosis not present

## 2023-10-28 DIAGNOSIS — G894 Chronic pain syndrome: Secondary | ICD-10-CM | POA: Diagnosis not present

## 2023-10-28 DIAGNOSIS — F1120 Opioid dependence, uncomplicated: Secondary | ICD-10-CM | POA: Diagnosis not present

## 2023-10-29 DIAGNOSIS — M25511 Pain in right shoulder: Secondary | ICD-10-CM | POA: Diagnosis not present

## 2023-10-29 DIAGNOSIS — Z4789 Encounter for other orthopedic aftercare: Secondary | ICD-10-CM | POA: Diagnosis not present

## 2023-11-03 DIAGNOSIS — S46011S Strain of muscle(s) and tendon(s) of the rotator cuff of right shoulder, sequela: Secondary | ICD-10-CM | POA: Diagnosis not present

## 2023-11-03 DIAGNOSIS — M25511 Pain in right shoulder: Secondary | ICD-10-CM | POA: Diagnosis not present

## 2023-11-05 DIAGNOSIS — M25511 Pain in right shoulder: Secondary | ICD-10-CM | POA: Diagnosis not present

## 2023-11-05 DIAGNOSIS — Z4789 Encounter for other orthopedic aftercare: Secondary | ICD-10-CM | POA: Diagnosis not present

## 2023-11-10 ENCOUNTER — Other Ambulatory Visit: Payer: Self-pay | Admitting: Adult Health

## 2023-11-10 DIAGNOSIS — I10 Essential (primary) hypertension: Secondary | ICD-10-CM

## 2023-11-10 DIAGNOSIS — E785 Hyperlipidemia, unspecified: Secondary | ICD-10-CM

## 2023-11-27 DIAGNOSIS — M25511 Pain in right shoulder: Secondary | ICD-10-CM | POA: Diagnosis not present

## 2023-11-28 ENCOUNTER — Other Ambulatory Visit: Payer: Self-pay | Admitting: Orthopaedic Surgery

## 2023-11-28 DIAGNOSIS — G8929 Other chronic pain: Secondary | ICD-10-CM

## 2023-12-03 ENCOUNTER — Inpatient Hospital Stay
Admission: RE | Admit: 2023-12-03 | Discharge: 2023-12-03 | Discharge status: Home / Self Care | Source: Ambulatory Visit | Attending: Orthopaedic Surgery

## 2023-12-03 DIAGNOSIS — M19011 Primary osteoarthritis, right shoulder: Secondary | ICD-10-CM | POA: Diagnosis not present

## 2023-12-03 DIAGNOSIS — M62511 Muscle wasting and atrophy, not elsewhere classified, right shoulder: Secondary | ICD-10-CM | POA: Diagnosis not present

## 2023-12-03 DIAGNOSIS — G8929 Other chronic pain: Secondary | ICD-10-CM

## 2023-12-03 DIAGNOSIS — S46011S Strain of muscle(s) and tendon(s) of the rotator cuff of right shoulder, sequela: Secondary | ICD-10-CM | POA: Diagnosis not present

## 2023-12-03 DIAGNOSIS — M25511 Pain in right shoulder: Secondary | ICD-10-CM | POA: Diagnosis not present

## 2023-12-16 ENCOUNTER — Other Ambulatory Visit: Payer: Self-pay | Admitting: Adult Health

## 2023-12-16 DIAGNOSIS — E66812 Obesity, class 2: Secondary | ICD-10-CM

## 2024-01-23 ENCOUNTER — Encounter (HOSPITAL_COMMUNITY): Payer: Self-pay

## 2024-01-23 NOTE — Patient Instructions (Addendum)
 SURGICAL WAITING ROOM VISITATION  Patients having surgery or a procedure may have no more than 2 support people in the waiting area - these visitors may rotate.    Children under the age of 90 must have an adult with them who is not the patient.  Visitors with respiratory illnesses are discouraged from visiting and should remain at home.  If the patient needs to stay at the hospital during part of their recovery, the visitor guidelines for inpatient rooms apply. Pre-op nurse will coordinate an appropriate time for 1 support person to accompany patient in pre-op.  This support person may not rotate.    Please refer to the [facility] website for the visitor guidelines for Inpatients (after your surgery is over and you are in a regular room).       Your procedure is scheduled on: 02-04-24   Report to [facility] Main Entrance    Report to admitting at      0730  AM   Call this number if you have problems the morning of surgery [phone]   Do not eat food   or drink liquids AFTER MIDNIGHT :except sips of water with meds:     Oral Hygiene is also important to reduce your risk of infection.                                    Remember - BRUSH YOUR TEETH THE MORNING OF SURGERY WITH YOUR REGULAR TOOTHPASTE   Stop all vitamins and herbal supplements 7 days before surgery.   Take these medicines the morning of surgery with A SIP OF WATER: oxycodone  if needed, atorvastatin              You may not have any metal on your body including hair pins, jewelry, and body piercing             Do not wear lotions, powders, perfumes/cologne, or deodorant               Men may shave face and neck.   Do not bring valuables to the hospital. [place] IS NOT             RESPONSIBLE   FOR VALUABLES.   Contacts, glasses, dentures or bridgework may not be worn into surgery.  DO NOT BRING YOUR HOME MEDICATIONS TO THE HOSPITAL. PHARMACY WILL DISPENSE MEDICATIONS LISTED ON YOUR MEDICATION  LIST TO YOU DURING YOUR ADMISSION IN THE HOSPITAL!    Patients discharged on the day of surgery will not be allowed to drive home.  Someone NEEDS to stay with you for the first 24 hours after anesthesia.   Special Instructions: Bring a copy of your healthcare power of attorney and living will documents the day of surgery if you haven't scanned them before.              Please read over the following fact sheets you were given: IF YOU HAVE QUESTIONS ABOUT YOUR PRE-OP INSTRUCTIONS PLEASE CALL [phone].    If you test positive for Covid or have been in contact with anyone that has tested positive in the last 10 days please notify you surgeon.  [place]- Preparing for Total Shoulder Arthroplasty    Before surgery, you can play an important role. Because skin is not sterile, your skin needs to be as free of germs as possible. You can reduce the number of germs on your  skin by using the following products. Benzoyl Peroxide Gel Reduces the number of germs present on the skin Applied twice a day to shoulder area starting two days before surgery    ==================================================================  Please follow these instructions carefully:  BENZOYL PEROXIDE 5% GEL  Please do not use if you have an allergy to benzoyl peroxide.   If your skin becomes reddened/irritated stop using the benzoyl peroxide.  Starting two days before surgery, apply as follows: Apply benzoyl peroxide in the morning and at night. Apply after taking a shower. If you are not taking a shower clean entire shoulder front, back, and side along with the armpit with a clean wet washcloth.  Place a quarter-sized dollop on your shoulder and rub in thoroughly, making sure to cover the front, back, and side of your shoulder, along with the armpit.   2 days before ____ AM   ____ PM              1 day before ____ AM   ____ PM                         Do this twice a day for two days.  (Last application is the  night before surgery, AFTER using the CHG soap as described below).  Do NOT apply benzoyl peroxide gel on the day of surgery.      Pre-operative 5 CHG Bath Instructions   You can play a key role in reducing the risk of infection after surgery. Your skin needs to be as free of germs as possible. You can reduce the number of germs on your skin by washing with CHG (chlorhexidine  gluconate) soap before surgery. CHG is an antiseptic soap that kills germs and continues to kill germs even after washing.   DO NOT use if you have an allergy to chlorhexidine /CHG or antibacterial soaps. If your skin becomes reddened or irritated, stop using the CHG and notify one of our RNs at [phone].   Please shower with the CHG soap starting 4 days before surgery using the following schedule:     Please keep in mind the following:  DO NOT shave, including legs and underarms, starting the day of your first shower.   You may shave your face at any point before/day of surgery.  Place clean sheets on your bed the day you start using CHG soap. Use a clean washcloth (not used since being washed) for each shower. DO NOT sleep with pets once you start using the CHG.   CHG Shower Instructions:  If you choose to wash your hair and private area, wash first with your normal shampoo/soap.  After you use shampoo/soap, rinse your hair and body thoroughly to remove shampoo/soap residue.  Turn the water OFF and apply about 3 tablespoons (45 ml) of CHG soap to a CLEAN washcloth.  Apply CHG soap ONLY FROM YOUR NECK DOWN TO YOUR TOES (washing for 3-5 minutes)  DO NOT use CHG soap on face, private areas, open wounds, or sores.  Pay special attention to the area where your surgery is being performed.  If you are having back surgery, having someone wash your back for you may be helpful. Wait 2 minutes after CHG soap is applied, then you may rinse off the CHG soap.  Pat dry with a clean towel  Put on clean clothes/pajamas    If you choose to wear lotion, please use ONLY the CHG-compatible lotions on the back of this  paper.     Additional instructions for the day of surgery: DO NOT APPLY any lotions, deodorants, cologne, or perfumes.   Put on clean/comfortable clothes.  Brush your teeth.  Ask your nurse before applying any prescription medications to the skin.      CHG Compatible Lotions   Aveeno Moisturizing lotion  Cetaphil Moisturizing Cream  Cetaphil Moisturizing Lotion  Clairol Herbal Essence Moisturizing Lotion, Dry Skin  Clairol Herbal Essence Moisturizing Lotion, Extra Dry Skin  Clairol Herbal Essence Moisturizing Lotion, Normal Skin  Curel Age Defying Therapeutic Moisturizing Lotion with Alpha Hydroxy  Curel Extreme Care Body Lotion  Curel Soothing Hands Moisturizing Hand Lotion  Curel Therapeutic Moisturizing Cream, Fragrance-Free  Curel Therapeutic Moisturizing Lotion, Fragrance-Free  Curel Therapeutic Moisturizing Lotion, Original Formula  Eucerin Daily Replenishing Lotion  Eucerin Dry Skin Therapy Plus Alpha Hydroxy Crme  Eucerin Dry Skin Therapy Plus Alpha Hydroxy Lotion  Eucerin Original Crme  Eucerin Original Lotion  Eucerin Plus Crme Eucerin Plus Lotion  Eucerin TriLipid Replenishing Lotion  Keri Anti-Bacterial Hand Lotion  Keri Deep Conditioning Original Lotion Dry Skin Formula Softly Scented  Keri Deep Conditioning Original Lotion, Fragrance Free Sensitive Skin Formula  Keri Lotion Fast Absorbing Fragrance Free Sensitive Skin Formula  Keri Lotion Fast Absorbing Softly Scented Dry Skin Formula  Keri Original Lotion  Keri Skin Renewal Lotion Keri Silky Smooth Lotion  Keri Silky Smooth Sensitive Skin Lotion  Nivea Body Creamy Conditioning Oil  Nivea Body Extra Enriched Lotion  Nivea Body Original Lotion  Nivea Body Sheer Moisturizing Lotion Nivea Crme  Nivea Skin Firming Lotion  NutraDerm 30 Skin Lotion  NutraDerm Skin Lotion  NutraDerm Therapeutic Skin Cream   NutraDerm Therapeutic Skin Lotion  ProShield Protective Hand Cream   Incentive Spirometer  An incentive spirometer is a tool that can help keep your lungs clear and active. This tool measures how well you are filling your lungs with each breath. Taking long deep breaths may help reverse or decrease the chance of developing breathing (pulmonary) problems (especially infection) following: A long period of time when you are unable to move or be active. BEFORE THE PROCEDURE  If the spirometer includes an indicator to show your best effort, your nurse or respiratory therapist will set it to a desired goal. If possible, sit up straight or lean slightly forward. Try not to slouch. Hold the incentive spirometer in an upright position. INSTRUCTIONS FOR USE  Sit on the edge of your bed if possible, or sit up as far as you can in bed or on a chair. Hold the incentive spirometer in an upright position. Breathe out normally. Place the mouthpiece in your mouth and seal your lips tightly around it. Breathe in slowly and as deeply as possible, raising the piston or the ball toward the top of the column. Hold your breath for 3-5 seconds or for as long as possible. Allow the piston or ball to fall to the bottom of the column. Remove the mouthpiece from your mouth and breathe out normally. Rest for a few seconds and repeat Steps 1 through 7 at least 10 times every 1-2 hours when you are awake. Take your time and take a few normal breaths between deep breaths. The spirometer may include an indicator to show your best effort. Use the indicator as a goal to work toward during each repetition. After each set of 10 deep breaths, practice coughing to be sure your lungs are clear. If you have an incision (the cut made at the time  of surgery), support your incision when coughing by placing a pillow or rolled up towels firmly against it. Once you are able to get out of bed, walk around indoors and cough well. You may  stop using the incentive spirometer when instructed by your caregiver.  RISKS AND COMPLICATIONS Take your time so you do not get dizzy or light-headed. If you are in pain, you may need to take or ask for pain medication before doing incentive spirometry. It is harder to take a deep breath if you are having pain. AFTER USE Rest and breathe slowly and easily. It can be helpful to keep track of a log of your progress. Your caregiver can provide you with a simple table to help with this. If you are using the spirometer at home, follow these instructions: SEEK MEDICAL CARE IF:  You are having difficultly using the spirometer. You have trouble using the spirometer as often as instructed. Your pain medication is not giving enough relief while using the spirometer. You develop fever of 100.5 F (38.1 C) or higher. SEEK IMMEDIATE MEDICAL CARE IF:  You cough up bloody sputum that had not been present before. You develop fever of 102 F (38.9 C) or greater. You develop worsening pain at or near the incision site. MAKE SURE YOU:  Understand these instructions. Will watch your condition. Will get help right away if you are not doing well or get worse. Document Released: 09/09/2006 Document Revised: 07/22/2011 Document Reviewed: 11/10/2006 [facility] Patient Information 2014 [place], [place].   ________________________________________________________________________

## 2024-01-23 NOTE — Progress Notes (Addendum)
 PCP - Darleene Shape, NP LOV 10-22-23 epic Cardiologist -   PPM/ICD -  Device Orders -  Rep Notified -   Chest x-ray -  EKG -  Stress Test -  ECHO -  Cardiac Cath -   Sleep Study -  CPAP -   Fasting Blood Sugar -  Checks Blood Sugar _____ times a day  Blood Thinner Instructions: Aspirin Instructions:  ERAS Protcol - PRE-SURGERY Ensure    COVID TEST-  COVID vaccine -  Activity-- Anesthesia review: HTN  Patient denies shortness of breath, fever, cough and chest pain at PAT appointment   All instructions explained to the patient, with a verbal understanding of the material. Patient agrees to go over the instructions while at home for a better understanding. Patient also instructed to self quarantine after being tested for COVID-19. The opportunity to ask questions was provided.

## 2024-01-27 ENCOUNTER — Encounter (HOSPITAL_COMMUNITY)
Admission: RE | Admit: 2024-01-27 | Discharge: 2024-01-27 | Disposition: A | Discharge status: Home / Self Care | Source: Ambulatory Visit | Attending: Orthopaedic Surgery | Admitting: Orthopaedic Surgery

## 2024-01-27 ENCOUNTER — Other Ambulatory Visit: Payer: Self-pay

## 2024-01-27 VITALS — BP 142/89 | HR 85 | Temp 98.4°F | Resp 16 | Ht 70.0 in | Wt 264.0 lb

## 2024-01-27 DIAGNOSIS — I10 Essential (primary) hypertension: Secondary | ICD-10-CM | POA: Insufficient documentation

## 2024-01-27 DIAGNOSIS — Z01818 Encounter for other preprocedural examination: Secondary | ICD-10-CM | POA: Insufficient documentation

## 2024-01-27 LAB — BASIC METABOLIC PANEL WITH GFR
Anion gap: 12 (ref 5–15)
BUN: 10 mg/dL (ref 6–20)
CO2: 24 mmol/L (ref 22–32)
Calcium: 9.6 mg/dL (ref 8.9–10.3)
Chloride: 102 mmol/L (ref 98–111)
Creatinine, Ser: 0.79 mg/dL (ref 0.61–1.24)
GFR, Estimated: >60 mL/min (ref >60)
Glucose, Bld: 96 mg/dL (ref 70–99)
Potassium: 4.4 mmol/L (ref 3.5–5.1)
Sodium: 139 mmol/L (ref 135–145)

## 2024-01-27 LAB — CBC
HCT: 52 % (ref 39.0–52.0)
Hemoglobin: 17.5 g/dL — ABNORMAL HIGH (ref 13.0–17.0)
MCH: 29.6 pg (ref 26.0–34.0)
MCHC: 33.7 g/dL (ref 30.0–36.0)
MCV: 88 fL (ref 80.0–100.0)
Platelets: 262 K/uL (ref 150–400)
RBC: 5.91 MIL/uL — ABNORMAL HIGH (ref 4.22–5.81)
RDW: 12.4 % (ref 11.5–15.5)
WBC: 6.8 K/uL (ref 4.0–10.5)
nRBC: 0 % (ref 0.0–0.2)

## 2024-01-27 NOTE — Progress Notes (Signed)
 COVID Vaccine received:  [x]  No []  Yes Date of any COVID positive Test in last 90 days: no PCP - Cory  a PA @ [facility] - n/a  Chest x-ray -  EKG -   Stress Test -  ECHO -  Cardiac Cath -   Bowel Prep - [x]  No  []   Yes ______  Pacemaker / ICD device [x]  No []  Yes   Spinal Cord Stimulator:[x]  No []  Yes       History of Sleep Apnea? [x]  No []  Yes   CPAP used?- [x]  No []  Yes    Does the patient monitor blood sugar?          [x]  No []  Yes  []  N/A  Patient has: [x]  NO Hx DM   []  Pre-DM                 []  DM1  []   DM2 Does patient have a [employer] or Dexacom? []  No []  Yes   Fasting Blood Sugar Ranges-  Checks Blood Sugar _____ times a day  GLP1 agonist / usual dose - Wegovy  last dose was 01/25/24 GLP1 instructions:  SGLT-2 inhibitors / usual dose - no SGLT-2 instructions:   Blood Thinner / Instructions:no Aspirin Instructions:no  Comments:   Activity level: Patient is able  to climb a flight of stairs without difficulty; []  No CP  []  No SOB,    Patient can perform ADLs without assistance.   Anesthesia review:   Patient denies shortness of breath, fever, cough and chest pain at PAT appointment.  Patient verbalized understanding and agreement to the Pre-Surgical Instructions that were given to them at this PAT appointment. Patient was also educated of the need to review these PAT instructions again prior to his/her surgery.I reviewed the appropriate phone numbers to call if they have any and questions or concerns.

## 2024-01-29 ENCOUNTER — Encounter (HOSPITAL_BASED_OUTPATIENT_CLINIC_OR_DEPARTMENT_OTHER): Payer: Self-pay | Admitting: Orthopaedic Surgery

## 2024-01-29 ENCOUNTER — Other Ambulatory Visit: Payer: Self-pay

## 2024-02-02 ENCOUNTER — Encounter (HOSPITAL_BASED_OUTPATIENT_CLINIC_OR_DEPARTMENT_OTHER)
Admission: RE | Admit: 2024-02-02 | Discharge: 2024-02-02 | Disposition: A | Discharge status: Home / Self Care | Source: Ambulatory Visit | Attending: Orthopaedic Surgery | Admitting: Orthopaedic Surgery

## 2024-02-02 DIAGNOSIS — G894 Chronic pain syndrome: Secondary | ICD-10-CM | POA: Diagnosis not present

## 2024-02-02 DIAGNOSIS — K219 Gastro-esophageal reflux disease without esophagitis: Secondary | ICD-10-CM | POA: Diagnosis not present

## 2024-02-02 DIAGNOSIS — E785 Hyperlipidemia, unspecified: Secondary | ICD-10-CM | POA: Diagnosis not present

## 2024-02-02 DIAGNOSIS — G4730 Sleep apnea, unspecified: Secondary | ICD-10-CM | POA: Diagnosis not present

## 2024-02-02 DIAGNOSIS — Z01812 Encounter for preprocedural laboratory examination: Secondary | ICD-10-CM | POA: Diagnosis not present

## 2024-02-02 DIAGNOSIS — I10 Essential (primary) hypertension: Secondary | ICD-10-CM | POA: Diagnosis not present

## 2024-02-02 DIAGNOSIS — F1120 Opioid dependence, uncomplicated: Secondary | ICD-10-CM | POA: Diagnosis not present

## 2024-02-02 DIAGNOSIS — Z79899 Other long term (current) drug therapy: Secondary | ICD-10-CM | POA: Diagnosis not present

## 2024-02-02 DIAGNOSIS — M75101 Unspecified rotator cuff tear or rupture of right shoulder, not specified as traumatic: Secondary | ICD-10-CM | POA: Diagnosis not present

## 2024-02-02 DIAGNOSIS — Z9889 Other specified postprocedural states: Secondary | ICD-10-CM | POA: Diagnosis not present

## 2024-02-02 DIAGNOSIS — M19011 Primary osteoarthritis, right shoulder: Secondary | ICD-10-CM | POA: Diagnosis not present

## 2024-02-03 DIAGNOSIS — Z9889 Other specified postprocedural states: Secondary | ICD-10-CM | POA: Diagnosis not present

## 2024-02-03 DIAGNOSIS — Z79899 Other long term (current) drug therapy: Secondary | ICD-10-CM | POA: Diagnosis not present

## 2024-02-03 DIAGNOSIS — G4730 Sleep apnea, unspecified: Secondary | ICD-10-CM | POA: Diagnosis not present

## 2024-02-03 DIAGNOSIS — I10 Essential (primary) hypertension: Secondary | ICD-10-CM | POA: Diagnosis not present

## 2024-02-03 DIAGNOSIS — M19011 Primary osteoarthritis, right shoulder: Secondary | ICD-10-CM | POA: Diagnosis not present

## 2024-02-03 DIAGNOSIS — Z01812 Encounter for preprocedural laboratory examination: Secondary | ICD-10-CM | POA: Diagnosis not present

## 2024-02-03 DIAGNOSIS — K219 Gastro-esophageal reflux disease without esophagitis: Secondary | ICD-10-CM | POA: Diagnosis not present

## 2024-02-03 DIAGNOSIS — M75101 Unspecified rotator cuff tear or rupture of right shoulder, not specified as traumatic: Secondary | ICD-10-CM | POA: Diagnosis not present

## 2024-02-03 DIAGNOSIS — E785 Hyperlipidemia, unspecified: Secondary | ICD-10-CM | POA: Diagnosis not present

## 2024-02-03 LAB — SURGICAL PCR SCREEN
MRSA, PCR: NEGATIVE
Staphylococcus aureus: NEGATIVE

## 2024-02-04 NOTE — Anesthesia Preprocedure Evaluation (Signed)
 Anesthesia Evaluation  Patient identified by MRN, date of birth, ID band Patient awake    Reviewed: Allergy & Precautions, NPO status , Patient's Chart, lab work & pertinent test results  Airway Mallampati: III  TM Distance: >3 FB Neck ROM: Limited    Dental  (+) Dental Advisory Given, Teeth Intact   Pulmonary sleep apnea    Pulmonary exam normal breath sounds clear to auscultation       Cardiovascular hypertension, Pt. on medications Normal cardiovascular exam Rhythm:Regular Rate:Normal     Neuro/Psych SCS  negative psych ROS   GI/Hepatic Neg liver ROS,GERD  Medicated,,  Endo/Other    Class 3 obesityObesity   Renal/GU negative Renal ROS     Musculoskeletal  RIGHT SHOULDER PAIN      DECREASED ROM OF RIGHT SHOULDER     Abdominal  (+) + obese  Peds  Hematology negative hematology ROS (+)   Anesthesia Other Findings   Reproductive/Obstetrics                              Anesthesia Physical Anesthesia Plan  ASA: 3  Anesthesia Plan: General   Post-op Pain Management: Tylenol  PO (pre-op)*, Regional block* and Celebrex  PO (pre-op)*   Induction: Intravenous  PONV Risk Score and Plan: 2 and Midazolam , Dexamethasone , Ondansetron  and Treatment may vary due to age or medical condition  Airway Management Planned: Oral ETT and Video Laryngoscope Planned  Additional Equipment:   Intra-op Plan:   Post-operative Plan: Extubation in OR  Informed Consent: I have reviewed the patients History and Physical, chart, labs and discussed the procedure including the risks, benefits and alternatives for the proposed anesthesia with the patient or authorized representative who has indicated his/her understanding and acceptance.     Dental advisory given  Plan Discussed with: CRNA  Anesthesia Plan Comments: (Risks of anesthesia explained at length. This includes, but is not limited to, sore  throat, damage to teeth, lips gums, tongue and vocal cords, nausea and vomiting, reactions to medications, stroke, heart attack, and death. All patient questions were answered and the patient wishes to proceed. Risks of peripheral nerve block explained at length. This includes, but is not limited to, bleeding, infection, reactions to the medications, seizures, damage to surrounding structures, damage to nerves, permanent weakness, numbness, tingling and pain. All patient questions were answered and patient wishes to proceed with nerve block. )         Anesthesia Quick Evaluation

## 2024-02-04 NOTE — H&P (Signed)
 PREOPERATIVE H&P  Chief Complaint: osteoarthritis of right shoulder  HPI: Bruce Terry is a 53 y.o. male who is scheduled for Procedure(s): ARTHROPLASTY, SHOULDER, TOTAL, REVERSE.   Patient has a past medical history significant for GERD, HLD, HTN.   Patient has had two prior rotator cuff repairs by another provider. He has continued pain and limitations with his shoulder.   Symptoms are rated as moderate to severe, and have been worsening.  This is significantly impairing activities of daily living.    Please see clinic note for further details on this patient's care.    He has elected for surgical management.   Past Medical History:  Diagnosis Date   GERD (gastroesophageal reflux disease)    was caused by another medication   Hemorrhoid    Hyperlipidemia    Hypertension    Low back pain    Past Surgical History:  Procedure Laterality Date   BACK SURGERY     x 5   COLONOSCOPY     ELBOW SURGERY Right    EYE SURGERY Bilateral 2022   cataracts   RADIOLOGY WITH ANESTHESIA N/A 02/08/2021   Procedure: MRI WITH ANESTHESIA WITH/WITHOUT CONTRAST;  Surgeon: Radiologist, Medication, MD;  Location: [facility] OR;  Service: Radiology;  Laterality: N/A;   RADIOLOGY WITH ANESTHESIA Right 05/09/2022   Procedure: MRI WITH ANESTHESIA RIGHT SHOULDER;  Surgeon: Radiologist, Medication, MD;  Location: [facility] OR;  Service: Radiology;  Laterality: Right;   right hand surgery Right    SHOULDER SURGERY Right    SPINAL CORD STIMULATOR IMPLANT  02/07/2021   at [facility] - has remote for stimulator   Social History   Socioeconomic History   Marital status: Married    Spouse name: Not on file   Number of children: Not on file   Years of education: Not on file   Highest education level: Not on file  Occupational History   Not on file  Tobacco Use   Smoking status: Never   Smokeless tobacco: Never  Vaping Use   Vaping status: Never Used  Substance and Sexual  Activity   Alcohol use: Yes    Alcohol/week: 4.0 standard drinks of alcohol    Types: 4 Cans of beer per week    Comment: occasional   Drug use: No   Sexual activity: Not on file  Other Topics Concern   Not on file  Social History Narrative   Not on file   Social Drivers of Health   Financial Resource Strain: Not on file  Food Insecurity: Not on file  Transportation Needs: Not on file  Physical Activity: Not on file  Stress: Not on file  Social Connections: Unknown (09/25/2021)   Received [facility]   Social Network    Social Network: Not on file   Family History  Problem Relation Age of Onset   Hypertension Mother    Cirrhosis Mother    Diabetes Mother    Hypothyroidism Mother    Obesity Mother    Kidney disease Mother    Alcohol abuse Father    Colon cancer Neg Hx    Esophageal cancer Neg Hx    Rectal cancer Neg Hx    Stomach cancer Neg Hx    Allergies  Allergen Reactions   Amitriptyline  Nausea And Vomiting   Morphine And Codeine     GI upset   Prior to Admission medications   Medication Sig Start Date End Date Taking? Authorizing Provider  atorvastatin  (  LIPITOR ) 80 MG tablet TAKE 1 TABLET BY MOUTH EVERY DAY 11/11/23  Yes Nafziger, Darleene, NP  lisinopril  (ZESTRIL ) 20 MG tablet TAKE 1 TABLET BY MOUTH EVERYDAY AT BEDTIME 11/11/23  Yes Nafziger, Darleene, NP  Oxycodone  HCl 10 MG TABS Take 10 mg by mouth every 6 (six) hours as needed (pain). 08/11/21  Yes [provider]  semaglutide -weight management (WEGOVY ) 2.4 MG/0.75ML SOAJ SQ injection Inject 2.4 Mg into the skin once a week 12/17/23  Yes Nafziger, Darleene, NP  testosterone  cypionate (DEPOTESTOSTERONE CYPIONATE) 200 MG/ML injection Inject 150 mg into the muscle every Sunday. 08/28/21  Yes [provider]    ROS: All other systems have been reviewed and were otherwise negative with the exception of those mentioned in the HPI and as above.  Physical Exam: General: Alert, no acute  distress Cardiovascular: No pedal edema Respiratory: No cyanosis, no use of accessory musculature GI: No organomegaly, abdomen is soft and non-tender Skin: No lesions in the area of chief complaint Neurologic: Sensation intact distally Psychiatric: Patient is competent for consent with normal mood and affect Lymphatic: No axillary or cervical lymphadenopathy  MUSCULOSKELETAL:  Right shoulder: active ROM to 90 degrees. Weakness with all cuff strength testing.   Imaging: Xrays demonstrate [place] 4B cuff tear arthropathy   BMI: Estimated body mass index is 36.26 kg/m as calculated from the following:   Height as of this encounter: 5' 11 (1.803 m).   Weight as of this encounter: 117.9 kg.  Lab Results  Component Value Date   ALBUMIN 4.7 03/06/2023   Diabetes: Patient does not have a diagnosis of diabetes.     Smoking Status:   reports that he has never smoked. He has never used smokeless tobacco.     Assessment: osteoarthritis of right shoulder  Plan: Plan for Procedure(s): ARTHROPLASTY, SHOULDER, TOTAL, REVERSE  The risks benefits and alternatives were discussed with the patient including but not limited to the risks of nonoperative treatment, versus surgical intervention including infection, bleeding, nerve injury,  blood clots, cardiopulmonary complications, morbidity, mortality, among others, and they were willing to proceed.   We additionally specifically discussed risks of axillary nerve injury, infection, periprosthetic fracture, continued pain and longevity of implants prior to beginning procedure.    Patient will be closely monitored in PACU for medical stabilization and pain control. If found stable in PACU, patient may be discharged home with outpatient follow-up. If any concerns regarding patient's stabilization patient will be admitted for observation after surgery. The patient is planning to be discharged home with outpatient PT.   The patient acknowledged  the explanation, agreed to proceed with the plan and consent was signed.   Operative Plan: Right reverse total shoulder arthroplasty with latissimus transfer Discharge Medications: standard DVT Prophylaxis: aspirin  Physical Therapy: outpatient PT Special Discharge needs: Sling (should bring with him). IceMan   Aleck LOISE Stalling, PA-C  02/04/2024 8:50 PM

## 2024-02-05 ENCOUNTER — Encounter (HOSPITAL_BASED_OUTPATIENT_CLINIC_OR_DEPARTMENT_OTHER): Payer: Self-pay | Admitting: Orthopaedic Surgery

## 2024-02-05 ENCOUNTER — Ambulatory Visit (HOSPITAL_BASED_OUTPATIENT_CLINIC_OR_DEPARTMENT_OTHER): Payer: Self-pay | Admitting: Anesthesiology

## 2024-02-05 ENCOUNTER — Ambulatory Visit (HOSPITAL_COMMUNITY)

## 2024-02-05 ENCOUNTER — Other Ambulatory Visit: Payer: Self-pay

## 2024-02-05 ENCOUNTER — Ambulatory Visit (HOSPITAL_BASED_OUTPATIENT_CLINIC_OR_DEPARTMENT_OTHER)
Admission: RE | Admit: 2024-02-05 | Discharge: 2024-02-05 | Disposition: A | Discharge status: Home / Self Care | Attending: Orthopaedic Surgery | Admitting: Orthopaedic Surgery

## 2024-02-05 ENCOUNTER — Encounter (HOSPITAL_BASED_OUTPATIENT_CLINIC_OR_DEPARTMENT_OTHER)
Admission: RE | Disposition: A | Discharge status: Home / Self Care | Payer: Self-pay | Source: Home / Self Care | Attending: Orthopaedic Surgery

## 2024-02-05 ENCOUNTER — Ambulatory Visit (HOSPITAL_BASED_OUTPATIENT_CLINIC_OR_DEPARTMENT_OTHER): Payer: Self-pay | Admitting: Physician Assistant

## 2024-02-05 DIAGNOSIS — M75111 Incomplete rotator cuff tear or rupture of right shoulder, not specified as traumatic: Secondary | ICD-10-CM | POA: Diagnosis not present

## 2024-02-05 DIAGNOSIS — G4730 Sleep apnea, unspecified: Secondary | ICD-10-CM | POA: Diagnosis not present

## 2024-02-05 DIAGNOSIS — M75101 Unspecified rotator cuff tear or rupture of right shoulder, not specified as traumatic: Secondary | ICD-10-CM | POA: Diagnosis not present

## 2024-02-05 DIAGNOSIS — K219 Gastro-esophageal reflux disease without esophagitis: Secondary | ICD-10-CM | POA: Insufficient documentation

## 2024-02-05 DIAGNOSIS — G8918 Other acute postprocedural pain: Secondary | ICD-10-CM | POA: Diagnosis not present

## 2024-02-05 DIAGNOSIS — Z01812 Encounter for preprocedural laboratory examination: Secondary | ICD-10-CM | POA: Diagnosis not present

## 2024-02-05 DIAGNOSIS — I10 Essential (primary) hypertension: Secondary | ICD-10-CM | POA: Insufficient documentation

## 2024-02-05 DIAGNOSIS — M19011 Primary osteoarthritis, right shoulder: Secondary | ICD-10-CM | POA: Insufficient documentation

## 2024-02-05 DIAGNOSIS — Z79899 Other long term (current) drug therapy: Secondary | ICD-10-CM | POA: Diagnosis not present

## 2024-02-05 DIAGNOSIS — Z9889 Other specified postprocedural states: Secondary | ICD-10-CM | POA: Insufficient documentation

## 2024-02-05 DIAGNOSIS — S46011D Strain of muscle(s) and tendon(s) of the rotator cuff of right shoulder, subsequent encounter: Secondary | ICD-10-CM | POA: Diagnosis not present

## 2024-02-05 DIAGNOSIS — Z96611 Presence of right artificial shoulder joint: Secondary | ICD-10-CM | POA: Diagnosis not present

## 2024-02-05 DIAGNOSIS — Z01818 Encounter for other preprocedural examination: Secondary | ICD-10-CM

## 2024-02-05 DIAGNOSIS — E785 Hyperlipidemia, unspecified: Secondary | ICD-10-CM | POA: Insufficient documentation

## 2024-02-05 HISTORY — PX: REVERSE SHOULDER ARTHROPLASTY: SHX5054

## 2024-02-05 SURGERY — ARTHROPLASTY, SHOULDER, TOTAL, REVERSE
Anesthesia: General | Site: Shoulder | Laterality: Right

## 2024-02-05 MED ORDER — ONDANSETRON HCL 4 MG PO TABS: 4.0000 mg | ORAL_TABLET | Freq: Three times a day (TID) | ORAL | 0 refills | Status: AC | PRN

## 2024-02-05 MED ORDER — GABAPENTIN 100 MG PO CAPS: 100.0000 mg | ORAL_CAPSULE | Freq: Three times a day (TID) | ORAL | 0 refills | Status: AC

## 2024-02-05 MED ORDER — CEFAZOLIN SODIUM-DEXTROSE 2-4 GM/100ML-% IV SOLN
2.0000 g | INTRAVENOUS | Status: AC
  Administered 2024-02-05: 3 g via INTRAVENOUS

## 2024-02-05 MED ORDER — FENTANYL CITRATE (PF) 100 MCG/2ML IJ SOLN
INTRAMUSCULAR | Status: AC
  Filled 2024-02-05: qty 2

## 2024-02-05 MED ORDER — SUGAMMADEX SODIUM 200 MG/2ML IV SOLN
INTRAVENOUS | Status: DC | PRN
  Administered 2024-02-05: 300 mg via INTRAVENOUS

## 2024-02-05 MED ORDER — FENTANYL CITRATE (PF) 100 MCG/2ML IJ SOLN
50.0000 ug | Freq: Once | INTRAMUSCULAR | Status: AC
  Administered 2024-02-05: 50 ug via INTRAVENOUS

## 2024-02-05 MED ORDER — MIDAZOLAM HCL 2 MG/2ML IJ SOLN
1.0000 mg | Freq: Once | INTRAMUSCULAR | Status: AC
  Administered 2024-02-05: 1 mg via INTRAVENOUS

## 2024-02-05 MED ORDER — LIDOCAINE 2% (20 MG/ML) 5 ML SYRINGE
INTRAMUSCULAR | Status: DC | PRN
  Administered 2024-02-05: 60 mg via INTRAVENOUS

## 2024-02-05 MED ORDER — BUPIVACAINE HCL (PF) 0.25 % IJ SOLN
INTRAMUSCULAR | Status: AC
  Filled 2024-02-05: qty 30

## 2024-02-05 MED ORDER — ACETAMINOPHEN 500 MG PO TABS
1000.0000 mg | ORAL_TABLET | Freq: Three times a day (TID) | ORAL | 0 refills | Status: AC
Start: 2024-02-05 — End: 2024-02-19

## 2024-02-05 MED ORDER — FENTANYL CITRATE (PF) 100 MCG/2ML IJ SOLN
25.0000 ug | INTRAMUSCULAR | Status: DC | PRN
  Administered 2024-02-05 (×2): 50 ug via INTRAVENOUS

## 2024-02-05 MED ORDER — LACTATED RINGERS IV SOLN
INTRAVENOUS | Status: DC
Start: 2024-02-05 — End: 2024-02-05

## 2024-02-05 MED ORDER — MIDAZOLAM HCL 2 MG/2ML IJ SOLN
INTRAMUSCULAR | Status: AC
  Filled 2024-02-05: qty 2

## 2024-02-05 MED ORDER — TRANEXAMIC ACID-NACL 1000-0.7 MG/100ML-% IV SOLN
INTRAVENOUS | Status: AC
  Filled 2024-02-05: qty 100

## 2024-02-05 MED ORDER — METHOCARBAMOL 500 MG PO TABS: 500.0000 mg | ORAL_TABLET | Freq: Three times a day (TID) | ORAL | 0 refills | Status: AC | PRN

## 2024-02-05 MED ORDER — ORAL CARE MOUTH RINSE: 15.0000 mL | Freq: Once | OROMUCOSAL | Status: DC

## 2024-02-05 MED ORDER — OXYCODONE HCL 5 MG PO TABS
10.0000 mg | ORAL_TABLET | Freq: Once | ORAL | Status: AC
  Administered 2024-02-05: 10 mg via ORAL

## 2024-02-05 MED ORDER — ROCURONIUM BROMIDE 10 MG/ML (PF) SYRINGE
PREFILLED_SYRINGE | INTRAVENOUS | Status: DC | PRN
  Administered 2024-02-05: 80 mg via INTRAVENOUS

## 2024-02-05 MED ORDER — VANCOMYCIN HCL 1000 MG IV SOLR
INTRAVENOUS | Status: AC
  Filled 2024-02-05: qty 20

## 2024-02-05 MED ORDER — ROCURONIUM BROMIDE 10 MG/ML (PF) SYRINGE
PREFILLED_SYRINGE | INTRAVENOUS | Status: AC
  Filled 2024-02-05: qty 10

## 2024-02-05 MED ORDER — DROPERIDOL 2.5 MG/ML IJ SOLN: 0.6250 mg | Freq: Once | INTRAMUSCULAR | Status: DC | PRN

## 2024-02-05 MED ORDER — DEXAMETHASONE SODIUM PHOSPHATE 10 MG/ML IJ SOLN
INTRAMUSCULAR | Status: DC | PRN
  Administered 2024-02-05: 5 mg via INTRAVENOUS

## 2024-02-05 MED ORDER — FENTANYL CITRATE (PF) 100 MCG/2ML IJ SOLN
INTRAMUSCULAR | Status: DC | PRN
  Administered 2024-02-05: 50 ug via INTRAVENOUS
  Administered 2024-02-05 (×2): 25 ug via INTRAVENOUS

## 2024-02-05 MED ORDER — ONDANSETRON HCL 4 MG/2ML IJ SOLN
INTRAMUSCULAR | Status: DC | PRN
  Administered 2024-02-05: 4 mg via INTRAVENOUS

## 2024-02-05 MED ORDER — ONDANSETRON HCL 4 MG/2ML IJ SOLN
INTRAMUSCULAR | Status: AC
Start: 2024-02-05 — End: 2024-02-05
  Filled 2024-02-05: qty 2

## 2024-02-05 MED ORDER — PROPOFOL 10 MG/ML IV BOLUS
INTRAVENOUS | Status: DC | PRN
  Administered 2024-02-05: 200 mg via INTRAVENOUS

## 2024-02-05 MED ORDER — 0.9 % SODIUM CHLORIDE (POUR BTL) OPTIME
TOPICAL | Status: DC | PRN
  Administered 2024-02-05: 3000 mL

## 2024-02-05 MED ORDER — CELECOXIB 200 MG PO CAPS
ORAL_CAPSULE | ORAL | Status: AC
Start: 2024-02-05 — End: 2024-02-05
  Filled 2024-02-05: qty 1

## 2024-02-05 MED ORDER — BUPIVACAINE LIPOSOME 1.3 % IJ SUSP
INTRAMUSCULAR | Status: DC | PRN
  Administered 2024-02-05: 10 mL via PERINEURAL

## 2024-02-05 MED ORDER — TRANEXAMIC ACID-NACL 1000-0.7 MG/100ML-% IV SOLN
1000.0000 mg | INTRAVENOUS | Status: AC
  Administered 2024-02-05: 1000 mg via INTRAVENOUS

## 2024-02-05 MED ORDER — CELECOXIB 200 MG PO CAPS
200.0000 mg | ORAL_CAPSULE | Freq: Once | ORAL | Status: AC
  Administered 2024-02-05: 200 mg via ORAL

## 2024-02-05 MED ORDER — MIDAZOLAM HCL 2 MG/2ML IJ SOLN
INTRAMUSCULAR | Status: DC | PRN
  Administered 2024-02-05: 2 mg via INTRAVENOUS

## 2024-02-05 MED ORDER — PROPOFOL 500 MG/50ML IV EMUL
INTRAVENOUS | Status: AC
  Filled 2024-02-05: qty 50

## 2024-02-05 MED ORDER — CEFAZOLIN SODIUM-DEXTROSE 3-4 GM/150ML-% IV SOLN
INTRAVENOUS | Status: AC
  Filled 2024-02-05: qty 150

## 2024-02-05 MED ORDER — DEXAMETHASONE SODIUM PHOSPHATE 10 MG/ML IJ SOLN
INTRAMUSCULAR | Status: AC
Start: 2024-02-05 — End: 2024-02-05
  Filled 2024-02-05: qty 1

## 2024-02-05 MED ORDER — BUPIVACAINE HCL (PF) 0.5 % IJ SOLN
INTRAMUSCULAR | Status: DC | PRN
  Administered 2024-02-05: 10 mL via PERINEURAL
  Administered 2024-02-05: 5 mL via PERINEURAL

## 2024-02-05 MED ORDER — CHLORHEXIDINE GLUCONATE 0.12 % MT SOLN: 15.0000 mL | Freq: Once | OROMUCOSAL | Status: DC

## 2024-02-05 MED ORDER — ACETAMINOPHEN 500 MG PO TABS: 1000.0000 mg | ORAL_TABLET | Freq: Once | ORAL | Status: AC

## 2024-02-05 MED ORDER — PHENYLEPHRINE HCL-NACL 20-0.9 MG/250ML-% IV SOLN
INTRAVENOUS | Status: DC | PRN
  Administered 2024-02-05: 50 ug/min via INTRAVENOUS

## 2024-02-05 MED ORDER — ACETAMINOPHEN 500 MG PO TABS
ORAL_TABLET | ORAL | Status: AC
Start: 2024-02-05 — End: 2024-02-05
  Filled 2024-02-05: qty 2

## 2024-02-05 MED ORDER — VANCOMYCIN HCL 1000 MG IV SOLR
INTRAVENOUS | Status: DC | PRN
  Administered 2024-02-05: 1000 mg via TOPICAL

## 2024-02-05 MED ORDER — AMOXICILLIN 500 MG PO CAPS: 500.0000 mg | ORAL_CAPSULE | Freq: Two times a day (BID) | ORAL | 0 refills | Status: AC

## 2024-02-05 MED ORDER — OXYCODONE HCL 5 MG PO TABS
ORAL_TABLET | ORAL | Status: AC
  Filled 2024-02-05: qty 2

## 2024-02-05 MED ORDER — ASPIRIN 81 MG PO CHEW: 81.0000 mg | CHEWABLE_TABLET | Freq: Two times a day (BID) | ORAL | 0 refills | Status: AC

## 2024-02-05 MED ORDER — LIDOCAINE 2% (20 MG/ML) 5 ML SYRINGE
INTRAMUSCULAR | Status: AC
  Filled 2024-02-05: qty 5

## 2024-02-05 MED ORDER — ACETAMINOPHEN 500 MG PO TABS
1000.0000 mg | ORAL_TABLET | Freq: Once | ORAL | Status: AC
Start: 2024-02-05 — End: 2024-02-05
  Administered 2024-02-05: 1000 mg via ORAL

## 2024-02-05 MED ORDER — CELECOXIB 200 MG PO CAPS: 200.0000 mg | ORAL_CAPSULE | Freq: Two times a day (BID) | ORAL | 0 refills | Status: AC

## 2024-02-05 SURGICAL SUPPLY — 54 items
BASEPLATE SHOULDER FW 15D 29 (Joint) IMPLANT
BIT DRILL 3.2 PERIPHERAL SCREW (BIT) IMPLANT
BLADE SAW SGTL 73X25 THK (BLADE) ×1 IMPLANT
BLADE SURG 10 STRL SS (BLADE) IMPLANT
BLADE SURG 15 STRL LF DISP TIS (BLADE) IMPLANT
BRUSH SCRUB EZ PLAIN DRY (MISCELLANEOUS) ×1 IMPLANT
CHLORAPREP W/TINT 26 (MISCELLANEOUS) ×1 IMPLANT
CLSR STERI-STRIP ANTIMIC 1/2X4 (GAUZE/BANDAGES/DRESSINGS) ×1 IMPLANT
COOLER ICEMAN CLASSIC (MISCELLANEOUS) ×1 IMPLANT
COVER BACK TABLE 60X90IN (DRAPES) ×1 IMPLANT
COVER MAYO STAND STRL (DRAPES) ×1 IMPLANT
CUP HUM REV SHLD 3/4 39 +3 (Cup) IMPLANT
DRAPE IMP U-DRAPE 54X76 (DRAPES) IMPLANT
DRAPE INCISE IOBAN 66X45 STRL (DRAPES) ×1 IMPLANT
DRAPE POUCH INSTRU U-SHP 10X18 (DRAPES) ×1 IMPLANT
DRAPE U-SHAPE 76X120 STRL (DRAPES) ×2 IMPLANT
DRSG AQUACEL AG ADV 3.5X 6 (GAUZE/BANDAGES/DRESSINGS) ×1 IMPLANT
ELECTRODE BLDE 4.0 EZ CLN MEGD (MISCELLANEOUS) ×1 IMPLANT
ELECTRODE REM PT RTRN 9FT ADLT (ELECTROSURGICAL) ×1 IMPLANT
FACESHIELD WRAPAROUND OR TEAM (MASK) ×2 IMPLANT
GAUZE XEROFORM 1X8 LF (GAUZE/BANDAGES/DRESSINGS) IMPLANT
GLENOSPHERE STD 39 (Joint) IMPLANT
GLOVE BIO SURGEON STRL SZ 6.5 (GLOVE) ×2 IMPLANT
GLOVE BIOGEL PI IND STRL 6.5 (GLOVE) ×1 IMPLANT
GLOVE BIOGEL PI IND STRL 8 (GLOVE) ×1 IMPLANT
GLOVE ECLIPSE 8.0 STRL XLNG CF (GLOVE) ×2 IMPLANT
GOWN STRL REUS W/ TWL LRG LVL3 (GOWN DISPOSABLE) ×2 IMPLANT
GOWN STRL REUS W/TWL XL LVL3 (GOWN DISPOSABLE) ×1 IMPLANT
GUIDEWIRE GLENOID 2.5X220 (WIRE) IMPLANT
KIT STABILIZATION SHOULDER (MISCELLANEOUS) ×1 IMPLANT
MANIFOLD NEPTUNE II (INSTRUMENTS) ×1 IMPLANT
PACK BASIN DAY SURGERY FS (CUSTOM PROCEDURE TRAY) ×1 IMPLANT
PACK SHOULDER (CUSTOM PROCEDURE TRAY) ×1 IMPLANT
PAD COLD SHLDR WRAP-ON (PAD) ×1 IMPLANT
PIN GUIDE 3X75 SHOULDER (PIN) IMPLANT
RESTRAINT HEAD UNIVERSAL NS (MISCELLANEOUS) ×1 IMPLANT
SCREW 5.5X26 (Screw) IMPLANT
SCREW BONE 6.5X40 SM (Screw) IMPLANT
SCREW PERIPHERAL 30 (Screw) IMPLANT
SET HNDPC FAN SPRY TIP SCT (DISPOSABLE) ×1 IMPLANT
SHEET MEDIUM DRAPE 40X70 STRL (DRAPES) ×1 IMPLANT
SLEEVE SCD COMPRESS KNEE MED (STOCKING) ×1 IMPLANT
SPIKE FLUID TRANSFER (MISCELLANEOUS) IMPLANT
SPONGE T-LAP 18X18 ~~LOC~~+RFID (SPONGE) ×1 IMPLANT
STEM HUM PERFORM 4 (Stem) IMPLANT
SUT ETHIBOND 2 V 37 (SUTURE) ×1 IMPLANT
SUT ETHIBOND NAB CT1 #1 30IN (SUTURE) ×1 IMPLANT
SUT ETHILON 3 0 PS 1 (SUTURE) IMPLANT
SUT MNCRL AB 4-0 PS2 18 (SUTURE) ×1 IMPLANT
SUT VIC AB 0 CT1 27XBRD ANBCTR (SUTURE) IMPLANT
SUT VIC AB 3-0 SH 27X BRD (SUTURE) ×1 IMPLANT
SUTURE FIBERWR #5 38 CONV NDL (SUTURE) ×2 IMPLANT
TOWEL GREEN STERILE FF (TOWEL DISPOSABLE) ×3 IMPLANT
TUBE SUCTION HIGH CAP CLEAR NV (SUCTIONS) ×1 IMPLANT

## 2024-02-05 NOTE — Anesthesia Procedure Notes (Addendum)
 Anesthesia Regional Block: Interscalene brachial plexus block (C5 selective)   Pre-Anesthetic Checklist: , timeout performed,  Correct Patient, Correct Site, Correct Laterality,  Correct Procedure, Correct Position, site marked,  Risks and benefits discussed,  Surgical consent,  Pre-op evaluation,  At surgeon's request and post-op pain management  Laterality: Upper and Right  Prep: chloraprep       Needles:  Injection technique: Single-shot  Needle Type: Stimulator Needle - 40     Needle Length: 4cm  Needle Gauge: 22     Additional Needles:   Procedures:,,,, ultrasound used (permanent image in chart),,    Narrative:  Start time: 02/05/2024 10:50 AM End time: 02/05/2024 11:00 AM Injection made incrementally with aspirations every 5 mL.  Performed by: Personally  Anesthesiologist: Darlyn Rush, MD  Additional Notes: BP cuff, SpO2 and EKG monitors applied. Sedation begun. Nerve location verified with ultrasound. Anesthetic injected incrementally, slowly, and after neg aspirations under direct u/s guidance. Good perineural spread. Tolerated well.

## 2024-02-05 NOTE — Progress Notes (Signed)
Assisted Dr. Lissa Hoard with right, interscalene , ultrasound guided block. Side rails up, monitors on throughout procedure. See vital signs in flow sheet. Tolerated Procedure well.

## 2024-02-05 NOTE — Anesthesia Postprocedure Evaluation (Signed)
 Anesthesia Post Note  Patient: [name]  Procedure(s) Performed: ARTHROPLASTY, SHOULDER, TOTAL, REVERSE (Right: Shoulder)     Patient location during evaluation: PACU Anesthesia Type: General Level of consciousness: sedated and patient cooperative Pain management: pain level controlled Vital Signs Assessment: post-procedure vital signs reviewed and stable Respiratory status: spontaneous breathing Cardiovascular status: stable Anesthetic complications: no   No notable events documented.  Last Vitals:  Vitals:   02/05/24 1101 02/05/24 1103  BP: 127/78 125/77  Pulse: 90 90  Resp:    Temp:    SpO2: 96% 96%    Last Pain:  Vitals:   02/05/24 1040  TempSrc: Temporal  PainSc:                  Norleen Pope

## 2024-02-05 NOTE — Transfer of Care (Signed)
 Immediate Anesthesia Transfer of Care Note  Patient: Bruce Terry  Procedure(s) Performed: Procedure(s) (LRB): ARTHROPLASTY, SHOULDER, TOTAL, REVERSE (Right)  Patient Location: PACU  Anesthesia Type: General  Level of Consciousness: awake, oriented, sedated and patient cooperative  Airway & Oxygen Therapy: Patient Spontanous Breathing and Patient connected to face mask oxygen  Post-op Assessment: Report given to PACU RN and Post -op Vital signs reviewed and stable  Post vital signs: Reviewed and stable  Complications: No apparent anesthesia complications Last Vitals:  Vitals Value Taken Time  BP 153/92 02/05/24 09:38  Temp    Pulse 90 02/05/24 09:42  Resp 19 02/05/24 09:42  SpO2 97 % 02/05/24 09:42  Vitals shown include unfiled device data.  Last Pain:  Vitals:   02/05/24 0626  TempSrc: Temporal  PainSc: 0-No pain         Complications: No notable events documented.

## 2024-02-05 NOTE — Anesthesia Procedure Notes (Signed)
 Procedure Name: Intubation Date/Time: 02/05/2024 8:08 AM  Performed by: Delayne Olam BIRCH, CRNAPre-anesthesia Checklist: Patient identified, Emergency Drugs available, Suction available and Patient being monitored Patient Re-evaluated:Patient Re-evaluated prior to induction Oxygen Delivery Method: Circle system utilized Preoxygenation: Pre-oxygenation with 100% oxygen Induction Type: IV induction Ventilation: Mask ventilation without difficulty and Two handed mask ventilation required Laryngoscope Size: Mac, 3 and Glidescope Grade View: Grade IV Tube type: Oral Number of attempts: 1 Airway Equipment and Method: Stylet and Oral airway Placement Confirmation: ETT inserted through vocal cords under direct vision, positive ETCO2 and breath sounds checked- equal and bilateral Secured at: 23 cm Tube secured with: Tape Dental Injury: Teeth and Oropharynx as per pre-operative assessment  Difficulty Due To: Difficulty was anticipated Comments: Large neck and tongue, class 4 airway - proceeded directly with glidescope intubation.  Clear view with glidescope and easily intubated, BBS +=, ETCO2 +, ETT secured at 23 cm lip

## 2024-02-05 NOTE — Op Note (Signed)
 Orthopaedic Surgery Operative Note (CSN: [id number])  Alm Glendia George  September 04, 1970 Date of Surgery: 02/05/2024   Diagnoses:  Right irreparable cuff tear  Procedure: Right reverse augmented Total Shoulder Arthroplasty   Operative Finding We initially planned on the latissimus transfer however the patient had a suprapectoral biceps tenodesis and the latissimus tendon fibers were damaged during this.  It was unable to be performed.  We instead performed a lateralized total shoulder arthroplasty.  Bone quality is extremely dense in the glenoid however I was able to get good fixation.  Upon opening and are creating an arthrotomy in the joint there was significant white material.  We found 1 anchor that had pulled loose and this could have been attributable to the white material however it was not obvious purulence.  That said I took specimens to be held for 2 weeks to rule out C acnes.  Post-operative plan: The patient will be NWB in sling.  The patient will be will be discharged from PACU if continues to be stable as was plan prior to surgery.  DVT prophylaxis Aspirin  81 mg twice daily for 6 weeks.  Pain control with PRN pain medication preferring oral medicines.  Follow up plan will be scheduled in approximately 7 days for incision check and XR.  Physical therapy to start immediately.  Implants:Tornier Perform humeral 4, +3 retentive poly, 39 standard glenosphere with a 29 full wedge baseplate, 40 center screw and 4 peripheral screws  Post-Op Diagnosis: Same Surgeons:Primary: Cristy Bonner DASEN, MD Assistants:Caroline McBane, PA-C Location: [facility] OR ROOM 1 Anesthesia: General with Exparel  Interscalene Antibiotics: Ancef  2g preop, Vancomycin  1000mg  locally Tourniquet time: None Estimated Blood Loss: 100 Complications: None Specimens: To help for 2 weeks to rule out C acnes Implants: Implant Name Type Inv. Item Serial No. Manufacturer Lot No. LRB No. Used Action  BASEPLATE SHOULDER FW 15D 29 -  [id number] Joint BASEPLATE SHOULDER FW 15D 29 [id number] [employer]  Right 1 Implanted  GLENOSPHERE STD 39 - [id number] Joint GLENOSPHERE STD 39 [id number] [employer]  Right 1 Implanted  SCREW BONE 6.5X40 SM - [id number] Screw SCREW BONE 6.5X40 SM  [employer]  Right 1 Implanted  SCREW 5.5X26 - [id number] Screw SCREW 5.5X26  [employer]  Right 2 Implanted  SCREW PERIPHERAL 30 - [id number] Screw SCREW PERIPHERAL 30  [employer]  Right 2 Implanted  STEM HUM PERFORM 4 - [id number] Stem STEM HUM PERFORM 4  [employer] [id number] Right 1 Implanted  CUP HUM REV SHLD 3/4 39 +3 - [id number] Cup CUP HUM REV SHLD 3/4 39 +3  [employer] [id number] Right 1 Implanted    Indications for Surgery:   Derion Kreiter is a 53 y.o. male with previous cuff repair attempts that have failed.  Benefits and risks of operative and nonoperative management were discussed prior to surgery with patient/guardian(s) and informed consent form was completed.  Infection and need for further surgery were discussed as was prosthetic stability and cuff issues.  We additionally specifically discussed risks of axillary nerve injury, infection, periprosthetic fracture, continued pain and longevity of implants prior to beginning procedure.      Procedure:   The patient was identified in the preoperative holding area where the surgical site was marked. Block placed by anesthesia with exparel .  The patient was taken to the OR where a procedural timeout was called and the above noted anesthesia was induced.  The patient was positioned beachchair on allen table with spider arm positioner.  Preoperative antibiotics were dosed.  The patient's right shoulder was prepped and draped in the usual sterile fashion.  A second preoperative timeout was called.       Standard deltopectoral approach was performed with a #10 blade. We dissected down to the subcutaneous tissues and the cephalic vein was taken laterally with the deltoid. Clavipectoral  fascia was incised in line with the incision. Deep retractors were placed. The long of the biceps tendon was identified and there was significant tenosynovitis present.    There was a previous suprapectoral biceps tenodesis.  We lowered this and connected to the pack itself.  At that point I was able to try and expose the latissimus however it was clearly scarred in from the suprapectoral biceps tenodesis and there was not enough tendon for transfer.  We aborted this portion of the case and proceeded.  The subscapularis was taken down in a full thickness layer with capsule along the humeral neck extending inferiorly around the humeral head. We continued releasing the capsule directly off of the osteophytes inferiorly all the way around the corner. This allowed us  to dislocate the humeral head.   There was significant white debris coming from the joint we found a small loose push lock type anchor that was sitting in the bicipital groove.  Based on this I worried about the possibility infection though there was no purulence.  I took multiple specimens and performed a synovectomy.  There were osteophytes along the inferior humeral neck. The osteophytes were removed with an osteotome and a rongeur.  Osteophytes were removed with a rongeur and an osteotome and the anatomic neck was well visualized.     A humeral cutting guide was used extra medullary with a pin to help control version. The version was set at 20 of retroversion. Humeral osteotomy was performed with an oscillating saw. The head fragment was passed off the back table.  A cut protector plate was placed.  The subscapularis was again identified and immediately we took care to palpate the axillary nerve anteriorly and verify its position with gentle palpation as well as the tug test.  We then released the SGHL with bovie cautery prior to placing a curved mayo at the junction of the anterior glenoid well above the axillary nerve and bluntly  dissecting the subscapularis from the capsule.  We then carefully protected the axillary nerve as we gently released the inferior capsule to fully mobilize the subscapularis.  An anterior deltoid retractor was then placed as well as a small Hohmann retractor superiorly.  The remaining labrum was removed circumferentially taking great care not to disrupt the posterior capsule.   At this point we felt based on blueprint templating that a full wedge augment was necessary.  We began by using a full wedge guide to place our center pin as was templated.  We had good position of this pin and we proceeded with our starter center drill.  This allowed for us  to use the 15 degree full wedge reamer obtaining circumferential witness marks and good bone preparation for ingrowth.  At this point we proceeded with our center drill and had an intact vault.  We then drilled our center screw to a length of 40 mm.    We selected a 6.5 mm x 40 mm screw and the full wedge baseplate which was placed in the same orientation as our reaming.  We double checked that we had good apposition of the base plate to bone and then proceeded to place 3 locking screws and one  nonlocking screw as is typical.   Next a 39 mm glenosphere was selected and impacted onto the baseplate. The center screw was tightened.  We turned attention back to the humeral side. The cut protector was removed.  We used the perform humeral sizing block to select the appropriate size which for this patient was a 4.  We then placed our center pin and reamed over it concentrically obtaining appropriate inset.  We then used our lateralizing chisel to prepare the lateral aspect of the humerus.  At that point we selected the appropriate implant trialing a 4.  Using this trial implant we trialed multiple polyethylene sizes settling on a 3 which provided good stability and range of motion without excess soft tissue tension. The offset was dialed in to match the normal  anatomy. The shoulder was trialed.  There was good ROM in all planes and the shoulder was stable with no inferior translation.  The real humeral implants were opened after again confirming sizes.  The trial was removed. #5 Fiberwire x4 sutures passed through the humeral neck for subscap repair. The humeral component was press-fit obtaining a secure fit. The joint was reduced and thoroughly irrigated with pulsatile lavage. Subscap was repaired back with #5 Fiberwire sutures through bone tunnels. Hemostasis was obtained. The deltopectoral interval was reapproximated with #1 Ethibond. The subcutaneous tissues were closed with 2-0 Vicryl and the skin was closed with running monocryl.    The wounds were cleaned and dried and an Aquacel dressing was placed. The drapes taken down. The arm was placed into sling with abduction pillow. Patient was awakened, extubated, and transferred to the recovery room in stable condition. There were no intraoperative complications. The sponge, needle, and attention counts were correct at the end of the case.     Aleck Stalling, PA-C, present and scrubbed throughout the case, critical for completion in a timely fashion, and for retraction, instrumentation, closure.

## 2024-02-05 NOTE — Discharge Instructions (Addendum)
 Bonner Hair MD, MPH Aleck Stalling, PA-C [facility] [address] [phone] (tel)   [phone] (fax)   POST-OPERATIVE INSTRUCTIONS - TOTAL SHOULDER REPLACEMENT    WOUND CARE You may leave the operative dressing in place until your follow-up appointment. KEEP THE INCISIONS CLEAN AND DRY. There may be a small amount of fluid/bleeding leaking at the surgical site. This is normal after surgery.  If it fills with liquid or blood please call us  immediately to change it for you. Use the provided ice machine or Ice packs as often as possible for the first 3-4 days, then as needed for pain relief.   Keep a layer of cloth or a shirt between your skin and the cooling unit to prevent frost bite as it can get very cold.  SHOWERING: - You may shower on Post-Op Day #2.  - The dressing is water resistant but do not scrub it as it may start to peel up.   - You may remove the sling for showering - Gently pat the area dry.  - Do not soak the shoulder in water.  - Do not go swimming in the pool or ocean until your incision has completely healed (about 4-6 weeks after surgery) - KEEP THE INCISIONS CLEAN AND DRY.  EXERCISES Wear the sling at all times  You may remove the sling for showering, but keep the arm across the chest or in a secondary sling.    Accidental/Purposeful External Rotation and shoulder flexion (reaching behind you) is to be avoided at all costs for the first month. It is ok to come out of your sling if your are sitting and have assistance for eating.   Do not lift anything heavier than 1 pound until we discuss it further in clinic.  It is normal for your fingers/hand to become more swollen after surgery and discolored from bruising.   This will resolve over the first few weeks usually after surgery. Please continue to ambulate and do not stay sitting or lying for too long.  Perform foot and wrist pumps to assist in circulation.  PHYSICAL  THERAPY - You will begin physical therapy soon after surgery (unless otherwise specified) - Please call to set up an appointment, if you do not already have one  - Let our office if there are any issues with scheduling your therapy  - You have a physical therapy appointment scheduled at [facility] (across the hall from our office) on 9/26   REGIONAL ANESTHESIA (NERVE BLOCKS) The anesthesia team may have performed a nerve block for you this is a great tool used to minimize pain.   The block may start wearing off overnight (between 8-24 hours postop) When the block wears off, your pain may go from nearly zero to the pain you would have had postop without the block. This is an abrupt transition but nothing dangerous is happening.   This can be a challenging period but utilize your as needed pain medications to try and manage this period. We suggest you use the pain medication the first night prior to going to bed, to ease this transition.  You may take an extra dose of narcotic when this happens if needed   POST-OP MEDICATIONS- Multimodal approach to pain control In general your pain will be controlled with a combination of substances.  Prescriptions unless otherwise discussed are electronically sent to your pharmacy.  This is a carefully made plan we use to minimize narcotic use.  Celebrex  - Anti-inflammatory medication taken on a scheduled basis Acetaminophen  - Non-narcotic pain medicine taken on a scheduled basis  Gabapentin  - this is to help with nerve based pain, take on a scheduled basis Aspirin  81mg  - This medicine is used to minimize the risk of blood clots after surgery. Robaxin  - this is a muscle relaxer, take as needed for muscle spasms Zofran  -  take as needed for nausea Amoxicillin  - this is an antibiotic, take as instructed  - You will continue your oxycodone  prescription as prescribed by your pain management provider  FOLLOW-UP If you develop a Fever (>101.5), Redness or  Drainage from the surgical incision site, please call our office to arrange for an evaluation. Please call the office to schedule a follow-up appointment for a wound check, 7-10 days post-operatively.  IF YOU HAVE ANY QUESTIONS, PLEASE FEEL FREE TO CALL OUR OFFICE.  HELPFUL INFORMATION  Your arm will be in a sling following surgery. You will be in this sling for the next 4 weeks.   You may be more comfortable sleeping in a semi-seated position the first few nights following surgery.  Keep a pillow propped under the elbow and forearm for comfort.  If you have a recliner type of chair it might be beneficial.  If not that is fine too, but it would be helpful to sleep propped up with pillows behind your operated shoulder as well under your elbow and forearm.  This will reduce pulling on the suture lines.  When dressing, put your operative arm in the sleeve first.  When getting undressed, take your operative arm out last.  Loose fitting, button-down shirts are recommended.  In most states it is against the law to drive while your arm is in a sling. And certainly against the law to drive while taking narcotics.  You may return to work/school in the next couple of days when you feel up to it. Desk work and typing in the sling is fine.  We suggest you use the pain medication the first night prior to going to bed, in order to ease any pain when the anesthesia wears off. You should avoid taking pain medications on an empty stomach as it will make you nauseous.  You should wean off your narcotic medicines as soon as you are able.     Most patients will be off narcotics before their first postop appointment.   Do not drink alcoholic beverages or take illicit drugs when taking pain medications.  Pain medication may make you constipated.  Below are a few solutions to try in this order: Decrease the amount of pain medication if you aren't having pain. Drink lots of decaffeinated fluids. Drink prune juice  and/or each dried prunes  If the first 3 don't work start with additional solutions Take Colace - an over-the-counter stool softener Take Senokot - an over-the-counter laxative Take Miralax - a stronger over-the-counter laxative   Dental Antibiotics:  We require dental prophylaxis for 2 years after a shoulder replacement  Contact your surgeon for an antibiotic prescription, prior to your dental procedure.   For more information including helpful videos and documents visit our website:   [email or web address]    Post Anesthesia Home Care Instructions  Activity: Get plenty of rest for the remainder of the day. A responsible individual must stay with you for 24 hours following the procedure.  For the next 24 hours, DO NOT: -Drive a car -Advertising copywriter -Drink alcoholic beverages -Take any medication unless instructed by your  physician -Make any legal decisions or sign important papers.  Meals: Start with liquid foods such as gelatin or soup. Progress to regular foods as tolerated. Avoid greasy, spicy, heavy foods. If nausea and/or vomiting occur, drink only clear liquids until the nausea and/or vomiting subsides. Call your physician if vomiting continues.  Special Instructions/Symptoms: Your throat may feel dry or sore from the anesthesia or the breathing tube placed in your throat during surgery. If this causes discomfort, gargle with warm salt water. The discomfort should disappear within 24 hours.  If you had a scopolamine patch placed behind your ear for the management of post- operative nausea and/or vomiting:  1. The medication in the patch is effective for 72 hours, after which it should be removed.  Wrap patch in a tissue and discard in the trash. Wash hands thoroughly with soap and water. 2. You may remove the patch earlier than 72 hours if you experience unpleasant side effects which may include dry mouth, dizziness or visual  disturbances. 3. Avoid touching the patch. Wash your hands with soap and water after contact with the patch.    Regional Anesthesia Blocks  1. You may not be able to move or feel the blocked extremity after a regional anesthetic block. This may last may last from 3-48 hours after placement, but it will go away. The length of time depends on the medication injected and your individual response to the medication. As the nerves start to wake up, you may experience tingling as the movement and feeling returns to your extremity. If the numbness and inability to move your extremity has not gone away after 48 hours, please call your surgeon.   2. The extremity that is blocked will need to be protected until the numbness is gone and the strength has returned. Because you cannot feel it, you will need to take extra care to avoid injury. Because it may be weak, you may have difficulty moving it or using it. You may not know what position it is in without looking at it while the block is in effect.  3. For blocks in the legs and feet, returning to weight bearing and walking needs to be done carefully. You will need to wait until the numbness is entirely gone and the strength has returned. You should be able to move your leg and foot normally before you try and bear weight or walk. You will need someone to be with you when you first try to ensure you do not fall and possibly risk injury.  4. Bruising and tenderness at the needle site are common side effects and will resolve in a few days.  5. Persistent numbness or new problems with movement should be communicated to the surgeon or the [facility] [phone] [facility] [phone]).  Information for Discharge Teaching: EXPAREL  (bupivacaine  liposome injectable suspension)   Pain relief is important to your recovery. The goal is to control your pain so you can move easier and return to your normal activities as soon as possible  after your procedure. Your physician may use several types of medicines to manage pain, swelling, and more.  Your surgeon or anesthesiologist gave you EXPAREL (bupivacaine ) to help control your pain after surgery.  EXPAREL  is a local anesthetic designed to release slowly over an extended period of time to provide pain relief by numbing the tissue around the surgical site. EXPAREL  is designed to release pain medication over time and can control pain for up to 72  hours. Depending on how you respond to EXPAREL , you may require less pain medication during your recovery. EXPAREL  can help reduce or eliminate the need for opioids during the first few days after surgery when pain relief is needed the most. EXPAREL  is not an opioid and is not addictive. It does not cause sleepiness or sedation.   Important! A teal colored band has been placed on your arm with the date, time and amount of EXPAREL  you have received. Please leave this armband in place for the full 96 hours following administration, and then you may remove the band. If you return to the hospital for any reason within 96 hours following the administration of EXPAREL , the armband provides important information that your health care providers to know, and alerts them that you have received this anesthetic.    Possible side effects of EXPAREL : Temporary loss of sensation or ability to move in the area where medication was injected. Nausea, vomiting, constipation Rarely, numbness and tingling in your mouth or lips, lightheadedness, or anxiety may occur. Call your doctor right away if you think you may be experiencing any of these sensations, or if you have other questions regarding possible side effects.  Follow all other discharge instructions given to you by your surgeon or nurse. Eat a healthy diet and drink plenty of water or other fluids.   Next dose of tylenol  will be at 12:30pm

## 2024-02-05 NOTE — Anesthesia Procedure Notes (Addendum)
 Anesthesia Regional Block: Interscalene brachial plexus block   Pre-Anesthetic Checklist: , timeout performed,  Correct Patient, Correct Site, Correct Laterality,  Correct Procedure, Correct Position, site marked,  Risks and benefits discussed,  Surgical consent,  Pre-op evaluation,  At surgeon's request and post-op pain management  Laterality: Upper and Right  Prep: chloraprep       Needles:  Injection technique: Single-shot  Needle Type: Stimulator Needle - 40     Needle Length: 4cm  Needle Gauge: 22     Additional Needles:   Procedures:, nerve stimulator,,, ultrasound used (permanent image in chart),,     Nerve Stimulator or Paresthesia:  Response: Deltoid, 0.5 mA, 100 ms  Additional Responses:   Narrative:  Start time: 02/05/2024 7:21 AM End time: 02/05/2024 7:41 AM Injection made incrementally with aspirations every 5 mL.  Performed by: Personally  Anesthesiologist: Darlyn Rush, MD  Additional Notes: BP cuff, SpO2 and EKG monitors applied. Sedation begun. Nerve location verified with ultrasound. Anesthetic injected incrementally, slowly, and after neg aspirations under direct u/s guidance. Good perineural spread. Tolerated well.

## 2024-02-05 NOTE — Interval H&P Note (Signed)
 All questions answered, patient wants to proceed with procedure. ? ?

## 2024-02-05 NOTE — Progress Notes (Signed)
 Pre local anesthic vital signs, Dr Darlyn at bedside in phase 2 injecting more local anesthic under ultrasound. No versed  or fentanyl  given during injection.

## 2024-02-05 NOTE — Progress Notes (Signed)
 Post local anesthic vital signs, patient stable

## 2024-02-06 ENCOUNTER — Encounter (HOSPITAL_BASED_OUTPATIENT_CLINIC_OR_DEPARTMENT_OTHER): Payer: Self-pay | Admitting: Orthopaedic Surgery

## 2024-02-06 DIAGNOSIS — S46011D Strain of muscle(s) and tendon(s) of the rotator cuff of right shoulder, subsequent encounter: Secondary | ICD-10-CM | POA: Diagnosis not present

## 2024-02-13 DIAGNOSIS — S46011D Strain of muscle(s) and tendon(s) of the rotator cuff of right shoulder, subsequent encounter: Secondary | ICD-10-CM | POA: Diagnosis not present

## 2024-02-19 DIAGNOSIS — Z96611 Presence of right artificial shoulder joint: Secondary | ICD-10-CM | POA: Diagnosis not present

## 2024-02-19 LAB — AEROBIC/ANAEROBIC CULTURE W GRAM STAIN (SURGICAL/DEEP WOUND)
Culture: NO GROWTH
Culture: NO GROWTH
Gram Stain: NONE SEEN
Gram Stain: NONE SEEN

## 2024-02-24 ENCOUNTER — Encounter: Payer: Self-pay | Admitting: *Deleted

## 2024-02-25 DIAGNOSIS — Z96611 Presence of right artificial shoulder joint: Secondary | ICD-10-CM | POA: Diagnosis not present

## 2024-03-03 DIAGNOSIS — Z96611 Presence of right artificial shoulder joint: Secondary | ICD-10-CM | POA: Diagnosis not present

## 2024-03-05 DIAGNOSIS — Z96611 Presence of right artificial shoulder joint: Secondary | ICD-10-CM | POA: Diagnosis not present

## 2024-03-08 DIAGNOSIS — Z96611 Presence of right artificial shoulder joint: Secondary | ICD-10-CM | POA: Diagnosis not present

## 2024-03-09 ENCOUNTER — Encounter: Admitting: Adult Health

## 2024-03-18 DIAGNOSIS — N5201 Erectile dysfunction due to arterial insufficiency: Secondary | ICD-10-CM | POA: Diagnosis not present

## 2024-03-18 DIAGNOSIS — E291 Testicular hypofunction: Secondary | ICD-10-CM | POA: Diagnosis not present

## 2024-04-13 ENCOUNTER — Ambulatory Visit: Payer: Self-pay | Admitting: Adult Health

## 2024-04-13 ENCOUNTER — Ambulatory Visit: Admitting: Adult Health

## 2024-04-13 ENCOUNTER — Encounter: Payer: Self-pay | Admitting: Adult Health

## 2024-04-13 VITALS — BP 138/82 | HR 94 | Temp 99.0°F | Ht 70.0 in | Wt 281.0 lb

## 2024-04-13 DIAGNOSIS — E66812 Obesity, class 2: Secondary | ICD-10-CM

## 2024-04-13 DIAGNOSIS — M545 Low back pain, unspecified: Secondary | ICD-10-CM

## 2024-04-13 DIAGNOSIS — Z0000 Encounter for general adult medical examination without abnormal findings: Secondary | ICD-10-CM

## 2024-04-13 DIAGNOSIS — I10 Essential (primary) hypertension: Secondary | ICD-10-CM

## 2024-04-13 DIAGNOSIS — Z125 Encounter for screening for malignant neoplasm of prostate: Secondary | ICD-10-CM

## 2024-04-13 DIAGNOSIS — Z6839 Body mass index (BMI) 39.0-39.9, adult: Secondary | ICD-10-CM | POA: Diagnosis not present

## 2024-04-13 DIAGNOSIS — E785 Hyperlipidemia, unspecified: Secondary | ICD-10-CM

## 2024-04-13 DIAGNOSIS — Z23 Encounter for immunization: Secondary | ICD-10-CM

## 2024-04-13 DIAGNOSIS — R7989 Other specified abnormal findings of blood chemistry: Secondary | ICD-10-CM

## 2024-04-13 DIAGNOSIS — G8929 Other chronic pain: Secondary | ICD-10-CM

## 2024-04-13 LAB — COMPREHENSIVE METABOLIC PANEL WITH GFR
ALT: 68 U/L — ABNORMAL HIGH (ref 0–53)
AST: 44 U/L — ABNORMAL HIGH (ref 0–37)
Albumin: 4.8 g/dL (ref 3.5–5.2)
Alkaline Phosphatase: 61 U/L (ref 39–117)
BUN: 14 mg/dL (ref 6–23)
CO2: 27 meq/L (ref 19–32)
Calcium: 9.6 mg/dL (ref 8.4–10.5)
Chloride: 100 meq/L (ref 96–112)
Creatinine, Ser: 0.8 mg/dL (ref 0.40–1.50)
GFR: 100.62 mL/min (ref >60.00)
Glucose, Bld: 88 mg/dL (ref 70–99)
Potassium: 3.9 meq/L (ref 3.5–5.1)
Sodium: 138 meq/L (ref 135–145)
Total Bilirubin: 0.6 mg/dL (ref 0.2–1.2)
Total Protein: 7.5 g/dL (ref 6.0–8.3)

## 2024-04-13 LAB — LIPID PANEL
Cholesterol: 202 mg/dL — ABNORMAL HIGH (ref 0–200)
HDL: 33.8 mg/dL — ABNORMAL LOW (ref >39.00)
LDL Cholesterol: 115 mg/dL — ABNORMAL HIGH (ref 0–99)
NonHDL: 168.67
Total CHOL/HDL Ratio: 6
Triglycerides: 268 mg/dL — ABNORMAL HIGH (ref 0.0–149.0)
VLDL: 53.6 mg/dL — ABNORMAL HIGH (ref 0.0–40.0)

## 2024-04-13 LAB — CBC
HCT: 49 % (ref 39.0–52.0)
Hemoglobin: 16.9 g/dL (ref 13.0–17.0)
MCHC: 34.4 g/dL (ref 30.0–36.0)
MCV: 88 fl (ref 78.0–100.0)
Platelets: 247 K/uL (ref 150.0–400.0)
RBC: 5.57 Mil/uL (ref 4.22–5.81)
RDW: 14 % (ref 11.5–15.5)
WBC: 7.4 K/uL (ref 4.0–10.5)

## 2024-04-13 LAB — TSH: TSH: 1.26 u[IU]/mL (ref 0.35–5.50)

## 2024-04-13 MED ORDER — WEGOVY 2.4 MG/0.75ML ~~LOC~~ SOAJ: SUBCUTANEOUS | 1 refills | Status: DC

## 2024-04-13 MED ORDER — ATORVASTATIN CALCIUM 80 MG PO TABS: 80.0000 mg | ORAL_TABLET | Freq: Every day | ORAL | 3 refills | Status: AC

## 2024-04-13 MED ORDER — LISINOPRIL 20 MG PO TABS: 20.0000 mg | ORAL_TABLET | Freq: Every day | ORAL | 3 refills | Status: AC

## 2024-04-13 NOTE — Patient Instructions (Signed)
 It was great seeing you today   We will follow up with you regarding your lab work   Please let me know if you need anything

## 2024-04-13 NOTE — Progress Notes (Signed)
 Subjective:    Patient ID: Bruce Terry, male    DOB: 20-Jul-1970, 53 y.o.   MRN: [id number]  HPI Patient presents for yearly preventative medicine examination. He is a pleasant 53 year old male who  has a past medical history of GERD (gastroesophageal reflux disease), Hemorrhoid, Hyperlipidemia, Hypertension, and Low back pain.  Hyperlipidemia-prescribed Lipitor  80 mg daily.  He denies myalgia or fatigue Lab Results  Component Value Date   CHOL 106 03/06/2023   HDL 35.20 (L) 03/06/2023   LDLCALC 57 03/06/2023   LDLDIRECT 129.0 07/21/2017   TRIG 65.0 03/06/2023   CHOLHDL 3 03/06/2023   Hypertension-managed with lisinopril  20 mg at bedtime.  He denies dizziness, lightheadedness, chest pain, or shortness of breath. He does check his BP at home with readings consistently in the 120/80's.  BP Readings from Last 3 Encounters:  04/13/24 138/82  02/05/24 125/77  01/27/24 (!) 142/89   Chronic back pain-has had total 5 spinal surgeries and is followed by [facility]. His back pain is managed with Oxycodone  10 mg 10 mg Q6 hours PRN  Morbid Obesity - Currently maintained on Wegovy  2.4 mg weekly, he has not had any in about a month. He had a right shoulder replacement done about 10 weeks ago and has not been able to exercise. He is currently going through PT and progressing well.  Wt Readings from Last 3 Encounters:  04/13/24 281 lb (127.5 kg)  02/05/24 267 lb 3.2 oz (121.2 kg)  01/27/24 264 lb (119.7 kg)   Low Testosterone  - managed by urology with testosterone  injections.   Concern for sleep apnea - symptoms include chronic fatigue, snoring, and poor sleep. He is unsure if he wakes up with apneic episodes.   All immunizations and health maintenance protocols were reviewed with the patient and needed orders were placed.  Appropriate screening laboratory values were ordered for the patient including screening of hyperlipidemia, renal function and hepatic  function. If indicated by BPH, a PSA was ordered.  Medication reconciliation,  past medical history, social history, problem list and allergies were reviewed in detail with the patient  Goals were established with regard to weight loss, exercise, and  diet in compliance with medications  He is up to date on routine colon cancer screening   Review of Systems  Constitutional: Negative.   HENT: Negative.    Eyes: Negative.   Respiratory: Negative.    Cardiovascular: Negative.   Gastrointestinal: Negative.   Endocrine: Negative.   Genitourinary: Negative.   Musculoskeletal:  Positive for arthralgias and back pain.  Skin: Negative.   Allergic/Immunologic: Negative.   Neurological: Negative.   Hematological: Negative.   Psychiatric/Behavioral: Negative.    All other systems reviewed and are negative.  Past Medical History:  Diagnosis Date   GERD (gastroesophageal reflux disease)    was caused by another medication   Hemorrhoid    Hyperlipidemia    Hypertension    Low back pain     Social History   Socioeconomic History   Marital status: Married    Spouse name: Not on file   Number of children: Not on file   Years of education: Not on file   Highest education level: Not on file  Occupational History   Not on file  Tobacco Use   Smoking status: Never   Smokeless tobacco: Never  Vaping Use   Vaping status: Never Used  Substance and Sexual Activity   Alcohol use: Yes    Alcohol/week: 4.0  standard drinks of alcohol    Types: 4 Cans of beer per week    Comment: occasional   Drug use: No   Sexual activity: Not on file  Other Topics Concern   Not on file  Social History Narrative   Not on file   Social Drivers of Health   Financial Resource Strain: Not on file  Food Insecurity: Not on file  Transportation Needs: Not on file  Physical Activity: Not on file  Stress: Not on file  Social Connections: Unknown (09/25/2021)   Received from [facility]   Social  Network    Social Network: Not on file  Intimate Partner Violence: Unknown (08/17/2021)   Received from [facility]   HITS    Physically Hurt: Not on file    Insult or Talk Down To: Not on file    Threaten Physical Harm: Not on file    Scream or Curse: Not on file    Past Surgical History:  Procedure Laterality Date   BACK SURGERY     x 5   COLONOSCOPY     ELBOW SURGERY Right    EYE SURGERY Bilateral 2022   cataracts   RADIOLOGY WITH ANESTHESIA N/A 02/08/2021   Procedure: MRI WITH ANESTHESIA WITH/WITHOUT CONTRAST;  Surgeon: Radiologist, Medication, MD;  Location: [facility] OR;  Service: Radiology;  Laterality: N/A;   RADIOLOGY WITH ANESTHESIA Right 05/09/2022   Procedure: MRI WITH ANESTHESIA RIGHT SHOULDER;  Surgeon: Radiologist, Medication, MD;  Location: [facility] OR;  Service: Radiology;  Laterality: Right;   REVERSE SHOULDER ARTHROPLASTY Right 02/05/2024   Procedure: ARTHROPLASTY, SHOULDER, TOTAL, REVERSE;  Surgeon: Cristy Bonner DASEN, MD;  Location: [facility];  Service: Orthopedics;  Laterality: Right;   right hand surgery Right    SHOULDER SURGERY Right    SPINAL CORD STIMULATOR IMPLANT  02/07/2021   at [facility] - has remote for stimulator    Family History  Problem Relation Age of Onset   Hypertension Mother    Cirrhosis Mother    Diabetes Mother    Hypothyroidism Mother    Obesity Mother    Kidney disease Mother    Alcohol abuse Father    Colon cancer Neg Hx    Esophageal cancer Neg Hx    Rectal cancer Neg Hx    Stomach cancer Neg Hx     Allergies  Allergen Reactions   Amitriptyline  Nausea And Vomiting   Morphine And Codeine     GI upset    Current Outpatient Medications on File Prior to Visit  Medication Sig Dispense Refill   atorvastatin  (LIPITOR ) 80 MG tablet TAKE 1 TABLET BY MOUTH EVERY DAY 90 tablet 0   gabapentin  (NEURONTIN ) 100 MG capsule Take 1 capsule (100 mg total) by mouth 3 (three) times daily. For pain 90 capsule 0    lisinopril  (ZESTRIL ) 20 MG tablet TAKE 1 TABLET BY MOUTH EVERYDAY AT BEDTIME 90 tablet 0   methocarbamol  (ROBAXIN ) 500 MG tablet Take 1 tablet (500 mg total) by mouth every 8 (eight) hours as needed for muscle spasms. 30 tablet 0   Oxycodone  HCl 10 MG TABS Take 10 mg by mouth every 6 (six) hours as needed (pain).     semaglutide -weight management (WEGOVY ) 2.4 MG/0.75ML SOAJ SQ injection Inject 2.4 Mg into the skin once a week 9 mL 1   testosterone  cypionate (DEPOTESTOSTERONE CYPIONATE) 200 MG/ML injection Inject 150 mg into the muscle every Sunday.     amoxicillin  (AMOXIL ) 500 MG capsule  TAKE 4 (FOUR) CAPSULES TOGETHER ONE HOUR PRIOR TO DENTAL PROCEDURE (Patient not taking: Reported on 04/13/2024)     No current facility-administered medications on file prior to visit.    BP 138/82   Pulse 94   Temp 99 F (37.2 C) (Oral)   Ht 5' 10 (1.778 m)   Wt 281 lb (127.5 kg)   SpO2 95%   BMI 40.32 kg/m       Objective:   Physical Exam Vitals and nursing note reviewed.  Constitutional:      General: He is not in acute distress.    Appearance: Normal appearance. He is not ill-appearing.  HENT:     Head: Normocephalic and atraumatic.     Right Ear: Tympanic membrane, ear canal and external ear normal. There is no impacted cerumen.     Left Ear: Tympanic membrane, ear canal and external ear normal. There is no impacted cerumen.     Nose: Nose normal. No congestion or rhinorrhea.     Mouth/Throat:     Mouth: Mucous membranes are moist.     Pharynx: Oropharynx is clear.  Eyes:     Extraocular Movements: Extraocular movements intact.     Conjunctiva/sclera: Conjunctivae normal.     Pupils: Pupils are equal, round, and reactive to light.  Neck:     Vascular: No carotid bruit.  Cardiovascular:     Rate and Rhythm: Normal rate and regular rhythm.     Pulses: Normal pulses.     Heart sounds: No murmur heard.    No friction rub. No gallop.  Pulmonary:     Effort: Pulmonary effort is normal.      Breath sounds: Normal breath sounds.  Abdominal:     General: Abdomen is flat. Bowel sounds are normal. There is no distension.     Palpations: Abdomen is soft. There is no mass.     Tenderness: There is no abdominal tenderness. There is no guarding or rebound.     Hernia: No hernia is present.  Musculoskeletal:        General: Normal range of motion.     Cervical back: Normal range of motion and neck supple.  Lymphadenopathy:     Cervical: No cervical adenopathy.  Skin:    General: Skin is warm and dry.     Capillary Refill: Capillary refill takes less than 2 seconds.  Neurological:     General: No focal deficit present.     Mental Status: He is alert and oriented to person, place, and time.  Psychiatric:        Mood and Affect: Mood normal.        Behavior: Behavior normal.        Thought Content: Thought content normal.        Judgment: Judgment normal.        Assessment & Plan:  1. Routine general medical examination at a health care facility (Primary) Today patient counseled on age appropriate routine health concerns for screening and prevention, each reviewed and up to date or declined. Immunizations reviewed and up to date or declined. Labs ordered and reviewed. Risk factors for depression reviewed and negative. Hearing function and visual acuity are intact. ADLs screened and addressed as needed. Functional ability and level of safety reviewed and appropriate. Education, counseling and referrals performed based on assessed risks today. Patient provided with a copy of personalized plan for preventive services. - Follow up in one year or sooner if needed - Eat healthy and exercise -  Follow up in one year or sooner if needed  2. Hyperlipidemia, unspecified hyperlipidemia type - Continue with Lipitor  80 mg daily  - Lipid panel; Future - TSH; Future - CBC; Future - Comprehensive metabolic panel with GFR; Future - atorvastatin  (LIPITOR ) 80 MG tablet; Take 1 tablet (80 mg  total) by mouth daily.  Dispense: 90 tablet; Refill: 3  3. Essential hypertension - Well controlled. No change in medication  - Lipid panel; Future - TSH; Future - CBC; Future - Comprehensive metabolic panel with GFR; Future - lisinopril  (ZESTRIL ) 20 MG tablet; Take 1 tablet (20 mg total) by mouth daily.  Dispense: 90 tablet; Refill: 3  4. Chronic bilateral low back pain without sciatica - Per pain management  - Lipid panel; Future - TSH; Future - CBC; Future - Comprehensive metabolic panel with GFR; Future  5. Class 2 severe obesity with serious comorbidity and body mass index (BMI) of 39.0 to 39.9 in adult, unspecified obesity type - Will get him back on wegovy . He is going to be able to be more active  - Follow up in 6 months  - Lipid panel; Future - TSH; Future - CBC; Future - Comprehensive metabolic panel with GFR; Future - semaglutide -weight management (WEGOVY ) 2.4 MG/0.75ML SOAJ SQ injection; Inject 2.4 Mg into the skin once a week  Dispense: 9 mL; Refill: 1  6. Low testosterone  - Per urology  - Lipid panel; Future - TSH; Future - CBC; Future - Comprehensive metabolic panel with GFR; Future  7. Prostate cancer screening - recently done at urology. We will not repeat that today   8. Need for pneumococcal vaccine  - Pneumococcal conjugate vaccine 20-valent (Prevnar 20)  Adison Jerger, NP

## 2024-04-14 ENCOUNTER — Other Ambulatory Visit: Payer: Self-pay | Admitting: Adult Health

## 2024-04-14 DIAGNOSIS — Z6839 Body mass index (BMI) 39.0-39.9, adult: Secondary | ICD-10-CM

## 2024-04-16 MED ORDER — WEGOVY 2.4 MG/0.75ML ~~LOC~~ SOAJ: SUBCUTANEOUS | 1 refills | Status: AC

## 2024-04-16 NOTE — Addendum Note (Signed)
 Addended by: VICCI LEADER R on: 04/16/2024 07:39 AM   Modules accepted: Orders

## 2024-04-20 ENCOUNTER — Telehealth: Payer: Self-pay

## 2024-04-20 NOTE — Telephone Encounter (Signed)
 Copied from CRM [id number]. Topic: Clinical - Medication Prior Auth >> Apr 19, 2024  3:09 PM Jasmin G wrote: Reason for CRM: [id number] - [place], [place] - [address] needs pre authorization to refill semaglutide -weight management (WEGOVY ) 2.4 MG/0.75ML SOAJ SQ injection.

## 2024-04-21 ENCOUNTER — Other Ambulatory Visit (HOSPITAL_COMMUNITY): Payer: Self-pay

## 2024-04-21 ENCOUNTER — Telehealth: Payer: Self-pay

## 2024-04-21 NOTE — Telephone Encounter (Signed)
 Left detailed message informing  of update.

## 2024-04-21 NOTE — Telephone Encounter (Signed)
 Pharmacy Patient Advocate Encounter   Received notification from Pt Calls Messages that prior authorization for Wegovy  2.4 is required/requested.   Insurance verification completed.   The patient is insured through [facility]   Tried to submit to [facility] and this message was received:

## 2024-04-27 ENCOUNTER — Ambulatory Visit: Admitting: Adult Health

## 2024-04-27 ENCOUNTER — Encounter: Payer: Self-pay | Admitting: Adult Health

## 2024-04-27 VITALS — BP 138/88 | HR 92 | Temp 98.7°F | Ht 70.0 in | Wt 289.0 lb

## 2024-04-27 DIAGNOSIS — L0291 Cutaneous abscess, unspecified: Secondary | ICD-10-CM

## 2024-04-27 MED ORDER — DOXYCYCLINE HYCLATE 100 MG PO CAPS: 100.0000 mg | ORAL_CAPSULE | Freq: Two times a day (BID) | ORAL | 0 refills | Status: AC

## 2024-04-27 NOTE — Telephone Encounter (Signed)
 Noted

## 2024-04-27 NOTE — Progress Notes (Signed)
 Subjective:    Patient ID: Bruce Terry, male    DOB: 1970-06-30, 53 y.o.   MRN: [id number]  HPI 53 year old male who  has a past medical history of GERD (gastroesophageal reflux disease), Hemorrhoid, Hyperlipidemia, Hypertension, and Low back pain.  He presents to the office today for an acute issue. He reports that he was outside 3 days ago and  got into a briar patch. The next day he reports having a large abscess on his left neck. Area is tender to touch. He has not noticed any drainage.   He is up to date on tetanus booster.    Review of Systems See HPI   Past Medical History:  Diagnosis Date   GERD (gastroesophageal reflux disease)    was caused by another medication   Hemorrhoid    Hyperlipidemia    Hypertension    Low back pain     Social History   Socioeconomic History   Marital status: Married    Spouse name: Not on file   Number of children: Not on file   Years of education: Not on file   Highest education level: Not on file  Occupational History   Not on file  Tobacco Use   Smoking status: Never   Smokeless tobacco: Never  Vaping Use   Vaping status: Never Used  Substance and Sexual Activity   Alcohol use: Yes    Alcohol/week: 4.0 standard drinks of alcohol    Types: 4 Cans of beer per week    Comment: occasional   Drug use: No   Sexual activity: Not on file  Other Topics Concern   Not on file  Social History Narrative   Not on file   Social Drivers of Health   Tobacco Use: Low Risk (04/27/2024)   Patient History    Smoking Tobacco Use: Never    Smokeless Tobacco Use: Never    Passive Exposure: Not on file  Financial Resource Strain: Not on file  Food Insecurity: Not on file  Transportation Needs: Not on file  Physical Activity: Not on file  Stress: Not on file  Social Connections: Unknown (09/25/2021)   Received [facility]   Social Network    Social Network: Not on file  Intimate Partner Violence: Unknown (08/17/2021)    Received from [facility]   HITS    Physically Hurt: Not on file    Insult or Talk Down To: Not on file    Threaten Physical Harm: Not on file    Scream or Curse: Not on file  Depression (PHQ2-9): Low Risk (04/13/2024)   Depression (PHQ2-9)    PHQ-2 Score: 0  Alcohol Screen: Not on file  Housing: Not on file  Utilities: Not on file  Health Literacy: Not on file    Past Surgical History:  Procedure Laterality Date   BACK SURGERY     x 5   COLONOSCOPY     ELBOW SURGERY Right    EYE SURGERY Bilateral 2022   cataracts   RADIOLOGY WITH ANESTHESIA N/A 02/08/2021   Procedure: MRI WITH ANESTHESIA WITH/WITHOUT CONTRAST;  Surgeon: Radiologist, Medication, MD;  Location: [facility];  Service: Radiology;  Laterality: N/A;   RADIOLOGY WITH ANESTHESIA Right 05/09/2022   Procedure: MRI WITH ANESTHESIA RIGHT SHOULDER;  Surgeon: Radiologist, Medication, MD;  Location: [facility];  Service: Radiology;  Laterality: Right;   REVERSE SHOULDER ARTHROPLASTY Right 02/05/2024   Procedure: ARTHROPLASTY, SHOULDER, TOTAL, REVERSE;  Surgeon: Cristy Bonner DASEN, MD;  Location: [facility];  Service: Orthopedics;  Laterality: Right;   right hand surgery Right    SHOULDER SURGERY Right    SPINAL CORD STIMULATOR IMPLANT  02/07/2021   at [facility] - has remote for stimulator    Family History  Problem Relation Age of Onset   Hypertension Mother    Cirrhosis Mother    Diabetes Mother    Hypothyroidism Mother    Obesity Mother    Kidney disease Mother    Alcohol abuse Father    Colon cancer Neg Hx    Esophageal cancer Neg Hx    Rectal cancer Neg Hx    Stomach cancer Neg Hx     Allergies[1]  Medications Ordered Prior to Encounter[2]  BP 138/88   Pulse 92   Temp 98.7 F (37.1 C) (Oral)   Ht 5' 10 (1.778 m)   Wt 289 lb (131.1 kg)   SpO2 96%   BMI 41.47 kg/m       Objective:   Physical Exam Vitals and nursing note reviewed.  Constitutional:      Appearance:  Normal appearance.  HENT:     Head:   Skin:    General: Skin is warm and dry.  Neurological:     General: No focal deficit present.     Mental Status: He is alert and oriented to person, place, and time.  Psychiatric:        Mood and Affect: Mood normal.        Behavior: Behavior normal.        Thought Content: Thought content normal.        Judgment: Judgment normal.        Assessment & Plan:  1. Abscess (Primary) - No indication for I& D at this time. Will place on Doxycycline  BID x 10 days. He can use a warm compress and if abscess starts draining then follow up in in the office for drainage.  - doxycycline  (VIBRAMYCIN ) 100 MG capsule; Take 1 capsule (100 mg total) by mouth 2 (two) times daily.  Dispense: 20 capsule; Refill: 0  Darleene Shape, NP     [1]  Allergies Allergen Reactions   Amitriptyline  Nausea And Vomiting   Morphine And Codeine     GI upset  [2]  Current Outpatient Medications on File Prior to Visit  Medication Sig Dispense Refill   atorvastatin  (LIPITOR ) 80 MG tablet Take 1 tablet (80 mg total) by mouth daily. 90 tablet 3   gabapentin  (NEURONTIN ) 100 MG capsule Take 1 capsule (100 mg total) by mouth 3 (three) times daily. For pain 90 capsule 0   lisinopril  (ZESTRIL ) 20 MG tablet Take 1 tablet (20 mg total) by mouth daily. 90 tablet 3   methocarbamol  (ROBAXIN ) 500 MG tablet Take 1 tablet (500 mg total) by mouth every 8 (eight) hours as needed for muscle spasms. 30 tablet 0   Oxycodone  HCl 10 MG TABS Take 10 mg by mouth every 6 (six) hours as needed (pain).     semaglutide -weight management (WEGOVY ) 2.4 MG/0.75ML SOAJ SQ injection Inject 2.4 Mg into the skin once a week 0.75 mL 1   testosterone  cypionate (DEPOTESTOSTERONE CYPIONATE) 200 MG/ML injection Inject 150 mg into the muscle every Sunday.     No current facility-administered medications on file prior to visit.
